# Patient Record
Sex: Female | Born: 1935 | Race: White | Hispanic: No | Marital: Married | Smoking: Never smoker
Health system: Southern US, Community
[De-identification: ages and names within clinical notes are randomized; demographics above are authoritative.]

## PROBLEM LIST (undated history)

## (undated) DIAGNOSIS — I2510 Atherosclerotic heart disease of native coronary artery without angina pectoris: Secondary | ICD-10-CM

## (undated) DIAGNOSIS — R5381 Other malaise: Secondary | ICD-10-CM

## (undated) DIAGNOSIS — I214 Non-ST elevation (NSTEMI) myocardial infarction: Secondary | ICD-10-CM

## (undated) DIAGNOSIS — I10 Essential (primary) hypertension: Secondary | ICD-10-CM

## (undated) DIAGNOSIS — Z9181 History of falling: Secondary | ICD-10-CM

## (undated) DIAGNOSIS — N390 Urinary tract infection, site not specified: Secondary | ICD-10-CM

## (undated) DIAGNOSIS — I330 Acute and subacute infective endocarditis: Secondary | ICD-10-CM

## (undated) DIAGNOSIS — M1990 Unspecified osteoarthritis, unspecified site: Secondary | ICD-10-CM

## (undated) DIAGNOSIS — K219 Gastro-esophageal reflux disease without esophagitis: Secondary | ICD-10-CM

## (undated) DIAGNOSIS — M2550 Pain in unspecified joint: Secondary | ICD-10-CM

## (undated) DIAGNOSIS — J189 Pneumonia, unspecified organism: Secondary | ICD-10-CM

## (undated) DIAGNOSIS — R1310 Dysphagia, unspecified: Secondary | ICD-10-CM

## (undated) DIAGNOSIS — R011 Cardiac murmur, unspecified: Secondary | ICD-10-CM

## (undated) DIAGNOSIS — C44310 Basal cell carcinoma of skin of unspecified parts of face: Secondary | ICD-10-CM

## (undated) DIAGNOSIS — I471 Supraventricular tachycardia, unspecified: Secondary | ICD-10-CM

## (undated) DIAGNOSIS — I639 Cerebral infarction, unspecified: Secondary | ICD-10-CM

## (undated) DIAGNOSIS — I4891 Unspecified atrial fibrillation: Secondary | ICD-10-CM

## (undated) HISTORY — DX: Atherosclerotic heart disease of native coronary artery without angina pectoris: I25.10

## (undated) HISTORY — DX: History of falling: Z91.81

## (undated) HISTORY — DX: Other malaise: R53.81

## (undated) HISTORY — DX: Acute and subacute infective endocarditis: I33.0

## (undated) HISTORY — DX: Essential (primary) hypertension: I10

## (undated) HISTORY — DX: Basal cell carcinoma of skin of unspecified parts of face: C44.310

## (undated) HISTORY — DX: Dysphagia, unspecified: R13.10

---

## 2012-09-10 ENCOUNTER — Ambulatory Visit (INDEPENDENT_AMBULATORY_CARE_PROVIDER_SITE_OTHER): Payer: Medicare Other | Admitting: Podiatry

## 2012-09-10 ENCOUNTER — Encounter: Payer: Self-pay | Admitting: Podiatry

## 2012-09-10 VITALS — BP 147/73 | HR 69 | Ht 61.0 in | Wt 126.0 lb

## 2012-09-10 DIAGNOSIS — B351 Tinea unguium: Secondary | ICD-10-CM | POA: Insufficient documentation

## 2012-09-10 DIAGNOSIS — M25579 Pain in unspecified ankle and joints of unspecified foot: Secondary | ICD-10-CM | POA: Insufficient documentation

## 2012-09-10 NOTE — Progress Notes (Signed)
S:  Getting used to new retirement home life. Has had multiple medical issues including esophagitis, hiatal hernia, back pain, passing out, and poly myalgia.  Patient requests toe nails trimmed. O:  All pedal pulses are palpable. All nails are hypertrophic and thick. 2nd right hurts from being deformed.  No gross osseous deformities. All epicritic and tactile sensations grossly intact. Normal response to Monofilament sensory testing and Vibratory sensation test. A:  Onychomycosis x 10. Pain from deformed toe. P:  Palliation as needed.  All nails debrided.

## 2012-09-10 NOTE — Patient Instructions (Addendum)
All nails debrided. No new problems noted.  Deformed nail on right 2nd.  Circulation normal. Neurologic examination on both feet normal. Return as needed.

## 2012-12-10 ENCOUNTER — Ambulatory Visit: Payer: Medicare Other | Admitting: Podiatry

## 2012-12-10 ENCOUNTER — Ambulatory Visit (INDEPENDENT_AMBULATORY_CARE_PROVIDER_SITE_OTHER): Payer: Medicare Other | Admitting: Podiatry

## 2012-12-10 DIAGNOSIS — M25579 Pain in unspecified ankle and joints of unspecified foot: Secondary | ICD-10-CM

## 2012-12-10 DIAGNOSIS — B351 Tinea unguium: Secondary | ICD-10-CM

## 2012-12-10 NOTE — Progress Notes (Signed)
Subjective: Patient requests toe nails trimmed. She cannot see them well enough to cut. They hurt when they are long. No new problems.  Objective:  All pedal pulses are palpable.  All nails are hypertrophic and thick. 2nd right hurts from being deformed.  No gross osseous deformities.  All epicritic and tactile sensations grossly intact. Normal response to Monofilament sensory testing and Vibratory sensation test.   Assessment:  Onychomycosis x 10.  Pain from deformed toe.   Ppan:  Palliation as needed.  All nails debrided.

## 2013-03-11 ENCOUNTER — Ambulatory Visit: Payer: Medicare Other | Admitting: Podiatry

## 2013-03-15 ENCOUNTER — Ambulatory Visit (INDEPENDENT_AMBULATORY_CARE_PROVIDER_SITE_OTHER): Payer: Medicare Other | Admitting: Podiatry

## 2013-03-15 ENCOUNTER — Ambulatory Visit: Payer: Medicare Other | Admitting: Podiatry

## 2013-03-15 ENCOUNTER — Encounter: Payer: Self-pay | Admitting: Podiatry

## 2013-03-15 VITALS — BP 121/66 | HR 67 | Ht 61.0 in | Wt 125.0 lb

## 2013-03-15 DIAGNOSIS — M25579 Pain in unspecified ankle and joints of unspecified foot: Secondary | ICD-10-CM

## 2013-03-15 DIAGNOSIS — B351 Tinea unguium: Secondary | ICD-10-CM

## 2013-03-15 NOTE — Progress Notes (Signed)
Subjective: Patient requests toe nails trimmed. They hurt when they are long. No new problems.   Objective:  All pedal pulses are palpable.  All nails are hypertrophic and thick. 2nd right hurts from being deformed.  No gross osseous deformities.  All epicritic and tactile sensations grossly intact. Normal response to Monofilament sensory testing and Vibratory sensation test.   Assessment:  Onychomycosis x 10.  Pain from deformed toe nails.  Plan:  Palliation as needed.  All nails debrided.

## 2013-03-15 NOTE — Patient Instructions (Signed)
Seen for hypertrophic nails. All nails debrided. Return in 3 months or as needed.  

## 2013-06-13 ENCOUNTER — Emergency Department (HOSPITAL_BASED_OUTPATIENT_CLINIC_OR_DEPARTMENT_OTHER)
Admission: EM | Admit: 2013-06-13 | Discharge: 2013-06-14 | Disposition: A | Discharge status: Home / Self Care | Payer: Medicare Other | Attending: Emergency Medicine | Admitting: Emergency Medicine

## 2013-06-13 ENCOUNTER — Encounter (HOSPITAL_BASED_OUTPATIENT_CLINIC_OR_DEPARTMENT_OTHER): Payer: Self-pay | Admitting: Emergency Medicine

## 2013-06-13 DIAGNOSIS — Z8739 Personal history of other diseases of the musculoskeletal system and connective tissue: Secondary | ICD-10-CM | POA: Insufficient documentation

## 2013-06-13 DIAGNOSIS — R112 Nausea with vomiting, unspecified: Secondary | ICD-10-CM | POA: Insufficient documentation

## 2013-06-13 DIAGNOSIS — N390 Urinary tract infection, site not specified: Secondary | ICD-10-CM

## 2013-06-13 DIAGNOSIS — R42 Dizziness and giddiness: Secondary | ICD-10-CM | POA: Insufficient documentation

## 2013-06-13 DIAGNOSIS — Z79899 Other long term (current) drug therapy: Secondary | ICD-10-CM | POA: Insufficient documentation

## 2013-06-13 DIAGNOSIS — K219 Gastro-esophageal reflux disease without esophagitis: Secondary | ICD-10-CM | POA: Insufficient documentation

## 2013-06-13 DIAGNOSIS — Z880 Allergy status to penicillin: Secondary | ICD-10-CM | POA: Insufficient documentation

## 2013-06-13 DIAGNOSIS — I4891 Unspecified atrial fibrillation: Secondary | ICD-10-CM | POA: Insufficient documentation

## 2013-06-13 DIAGNOSIS — R51 Headache: Secondary | ICD-10-CM | POA: Insufficient documentation

## 2013-06-13 HISTORY — DX: Pain in unspecified joint: M25.50

## 2013-06-13 HISTORY — DX: Gastro-esophageal reflux disease without esophagitis: K21.9

## 2013-06-13 HISTORY — DX: Unspecified osteoarthritis, unspecified site: M19.90

## 2013-06-13 HISTORY — DX: Unspecified atrial fibrillation: I48.91

## 2013-06-13 MED ORDER — METOCLOPRAMIDE HCL 5 MG/ML IJ SOLN
5.0000 mg | Freq: Once | INTRAMUSCULAR | Status: AC
  Administered 2013-06-13: 5 mg via INTRAVENOUS
  Filled 2013-06-13: qty 2

## 2013-06-13 MED ORDER — SODIUM CHLORIDE 0.9 % IV SOLN
INTRAVENOUS | Status: DC
  Administered 2013-06-13: via INTRAVENOUS

## 2013-06-13 NOTE — ED Notes (Signed)
Pt. Reports to EMS that she has had nausea no vomiting and no diarrhea.  Pt. Also reports a headache.  Pt. Skin is warm and dry.

## 2013-06-13 NOTE — ED Provider Notes (Signed)
CSN: [id number]     Arrival date & time 06/13/13  2312 History   None    Chief Complaint  Patient presents with  . Nausea     (Consider location/radiation/quality/duration/timing/severity/associated sxs/prior Treatment) HPI This is an 80 year old female who developed nausea yesterday evening about 5 PM. She ate a small amount of matter and subsequently began retching. She improved but as she was getting ready for bed started retching again. She denies diarrhea, abdominal pain, chest pain, shortness of breath or dysuria. She has had some dizziness but has difficulty characterizing it. She had a mild headache earlier that is resolving. She is having no focal neurologic deficits. She recently started Cipro for a self diagnosed urinary tract infection. Symptoms are moderate and worse when moving around. She has taken no medications to treat them. She has not taken her evening dose of Lopressor.   Past Medical History  Diagnosis Date  . Gastro-esophageal reflux   . Arthralgia   . A-fib   . Osteoarthritis    No past surgical history on file. No family history on file. History  Substance Use Topics  . Smoking status: Never Smoker   . Smokeless tobacco: Never Used  . Alcohol Use: Not on file   OB History   Grav Para Term Preterm Abortions TAB SAB Ect Mult Living                 Review of Systems  All other systems reviewed and are negative.      Allergies  Iodine; Novocain; Penicillins; and Sulfa antibiotics  Home Medications   Current Outpatient Rx  Name  Route  Sig  Dispense  Refill  . benazepril (LOTENSIN) 10 MG tablet   Oral   Take 10 mg by mouth daily.         . digoxin (LANOXIN) 0.125 MG tablet   Oral   Take 0.125 mg by mouth daily.         . fish oil-omega-3 fatty acids 1000 MG capsule   Oral   Take 2 g by mouth daily.         . furosemide (LASIX) 20 MG tablet               . metoprolol tartrate (LOPRESSOR) 25 MG tablet   Oral   Take 25 mg by  mouth. Half tab twice daily         . NEXIUM 40 MG capsule               . PROAIR HFA 108 (90 BASE) MCG/ACT inhaler               . tobramycin (TOBREX) 0.3 % ophthalmic solution               . traMADol (ULTRAM) 50 MG tablet   Oral   Take 50 mg by mouth 3 (three) times daily as needed.           BP 183/80  Pulse 94  Temp(Src) 98.1 F (36.7 C) (Oral)  Resp 20  SpO2 98%  Physical Exam General: Well-developed, well-nourished female in no acute distress; appearance consistent with age of record HENT: normocephalic; atraumatic Eyes: pupils equal, round and reactive to light; extraocular muscles intact; lens implants Neck: supple Heart: regular rate and rhythm; 3/6 systolic murmur loudest at left upper sternal border Lungs: Clear to auscultation bilaterally; hiccoughs Abdomen: soft; nondistended; nontender; no masses or hepatosplenomegaly; bowel sounds  hyperactive  Extremities: arthritic changes ; pulses normal  Neurologic: Awake, alert and oriented; motor function intact in all extremities and symmetric; no facial droop Skin: Warm and dry Psychiatric: Normal mood and affect    ED Course  Procedures (including critical care time)   MDM   Nursing notes and vitals signs, including pulse oximetry, reviewed.  Summary of this visit's results, reviewed by myself:  Labs:  Results for orders placed during the hospital encounter of 06/13/13 (from the past 24 hour(s))  DIGOXIN LEVEL     Status: None   Collection Time    06/13/13 11:45 PM      Result Value Ref Range   Digoxin Level 0.9  0.8 - 2.0 ng/mL  BASIC METABOLIC PANEL     Status: Abnormal   Collection Time    06/13/13 11:45 PM      Result Value Ref Range   Sodium 129 (*) 137 - 147 mEq/L   Potassium 5.0  3.7 - 5.3 mEq/L   Chloride 92 (*) 96 - 112 mEq/L   CO2 22  19 - 32 mEq/L   Glucose, Bld 145 (*) 70 - 99 mg/dL   BUN 28 (*) 6 - 23 mg/dL   Creatinine, Ser 1.00  0.50 - 1.10 mg/dL   Calcium 9.6  8.4 -  10.5 mg/dL   GFR calc non Af Amer 49 (*) >90 mL/min   GFR calc Af Amer 57 (*) >90 mL/min  CBC WITH DIFFERENTIAL     Status: Abnormal   Collection Time    06/13/13 11:45 PM      Result Value Ref Range   WBC 14.6 (*) 4.0 - 10.5 K/uL   RBC 4.69  3.87 - 5.11 MIL/uL   Hemoglobin 13.9  12.0 - 15.0 g/dL   HCT 40.6  36.0 - 46.0 %   MCV 86.6  78.0 - 100.0 fL   MCH 29.6  26.0 - 34.0 pg   MCHC 34.2  30.0 - 36.0 g/dL   RDW 13.5  11.5 - 15.5 %   Platelets 237  150 - 400 K/uL   Neutrophils Relative % 92 (*) 43 - 77 %   Neutro Abs 13.4 (*) 1.7 - 7.7 K/uL   Lymphocytes Relative 5 (*) 12 - 46 %   Lymphs Abs 0.8  0.7 - 4.0 K/uL   Monocytes Relative 3  3 - 12 %   Monocytes Absolute 0.4  0.1 - 1.0 K/uL   Eosinophils Relative 0  0 - 5 %   Eosinophils Absolute 0.0  0.0 - 0.7 K/uL   Basophils Relative 0  0 - 1 %   Basophils Absolute 0.0  0.0 - 0.1 K/uL  URINALYSIS, ROUTINE W REFLEX MICROSCOPIC     Status: Abnormal   Collection Time    06/14/13  1:30 AM      Result Value Ref Range   Color, Urine YELLOW  YELLOW   APPearance CLEAR  CLEAR   Specific Gravity, Urine 1.022  1.005 - 1.030   pH 5.5  5.0 - 8.0   Glucose, UA NEGATIVE  NEGATIVE mg/dL   Hgb urine dipstick NEGATIVE  NEGATIVE   Bilirubin Urine NEGATIVE  NEGATIVE   Ketones, ur NEGATIVE  NEGATIVE mg/dL   Protein, ur NEGATIVE  NEGATIVE mg/dL   Urobilinogen, UA 0.2  0.0 - 1.0 mg/dL   Nitrite NEGATIVE  NEGATIVE   Leukocytes, UA TRACE (*) NEGATIVE  URINE MICROSCOPIC-ADD ON     Status: Abnormal   Collection Time    06/14/13  1:30 AM  Result Value Ref Range   Squamous Epithelial / LPF RARE  RARE   WBC, UA 7-10  <3 WBC/hpf   RBC / HPF 0-2  <3 RBC/hpf   Bacteria, UA MANY (*) RARE   Urine-Other MUCOUS PRESENT     1:58 AM Nausea, hiccuping improved after Reglan 5 mg IV.  3:00 AM Able to move around without getting nauseated.  6:05 AM Drinking fluids without emesis. Rocephin 1 g given for urinary tract infection. Patient's nausea with  movement may represent vertigo we will treat her with meclizine and she responded well to IV Benadryl.      Wynetta Fines, MD 06/14/13 [phone]

## 2013-06-14 LAB — URINE CULTURE
Colony Count: NO GROWTH
Culture: NO GROWTH

## 2013-06-14 LAB — BASIC METABOLIC PANEL
BUN: 28 mg/dL — ABNORMAL HIGH (ref 6–23)
CALCIUM: 9.6 mg/dL (ref 8.4–10.5)
CO2: 22 mEq/L (ref 19–32)
Chloride: 92 mEq/L — ABNORMAL LOW (ref 96–112)
Creatinine, Ser: 1 mg/dL (ref 0.50–1.10)
GFR calc Af Amer: 57 mL/min — ABNORMAL LOW (ref >90)
GFR, EST NON AFRICAN AMERICAN: 49 mL/min — AB (ref >90)
GLUCOSE: 145 mg/dL — AB (ref 70–99)
Potassium: 5 mEq/L (ref 3.7–5.3)
SODIUM: 129 meq/L — AB (ref 137–147)

## 2013-06-14 LAB — CBC WITH DIFFERENTIAL/PLATELET
Basophils Absolute: 0 10*3/uL (ref 0.0–0.1)
Basophils Relative: 0 % (ref 0–1)
Eosinophils Absolute: 0 10*3/uL (ref 0.0–0.7)
Eosinophils Relative: 0 % (ref 0–5)
HCT: 40.6 % (ref 36.0–46.0)
Hemoglobin: 13.9 g/dL (ref 12.0–15.0)
LYMPHS ABS: 0.8 10*3/uL (ref 0.7–4.0)
LYMPHS PCT: 5 % — AB (ref 12–46)
MCH: 29.6 pg (ref 26.0–34.0)
MCHC: 34.2 g/dL (ref 30.0–36.0)
MCV: 86.6 fL (ref 78.0–100.0)
MONOS PCT: 3 % (ref 3–12)
Monocytes Absolute: 0.4 10*3/uL (ref 0.1–1.0)
NEUTROS ABS: 13.4 10*3/uL — AB (ref 1.7–7.7)
NEUTROS PCT: 92 % — AB (ref 43–77)
PLATELETS: 237 10*3/uL (ref 150–400)
RBC: 4.69 MIL/uL (ref 3.87–5.11)
RDW: 13.5 % (ref 11.5–15.5)
WBC: 14.6 10*3/uL — AB (ref 4.0–10.5)

## 2013-06-14 LAB — URINALYSIS, ROUTINE W REFLEX MICROSCOPIC
Bilirubin Urine: NEGATIVE
GLUCOSE, UA: NEGATIVE mg/dL
HGB URINE DIPSTICK: NEGATIVE
Ketones, ur: NEGATIVE mg/dL
Nitrite: NEGATIVE
PH: 5.5 (ref 5.0–8.0)
Protein, ur: NEGATIVE mg/dL
SPECIFIC GRAVITY, URINE: 1.022 (ref 1.005–1.030)
Urobilinogen, UA: 0.2 mg/dL (ref 0.0–1.0)

## 2013-06-14 LAB — URINE MICROSCOPIC-ADD ON

## 2013-06-14 LAB — DIGOXIN LEVEL: DIGOXIN LVL: 0.9 ng/mL (ref 0.8–2.0)

## 2013-06-14 MED ORDER — MECLIZINE HCL 12.5 MG PO TABS: 12.5000 mg | ORAL_TABLET | Freq: Three times a day (TID) | ORAL | Status: AC | PRN

## 2013-06-14 MED ORDER — CEFTRIAXONE SODIUM 1 G IJ SOLR
INTRAMUSCULAR | Status: AC
  Filled 2013-06-14: qty 10

## 2013-06-14 MED ORDER — CIPROFLOXACIN HCL 250 MG PO TABS: 250.0000 mg | ORAL_TABLET | Freq: Two times a day (BID) | ORAL | Status: DC

## 2013-06-14 MED ORDER — DIPHENHYDRAMINE HCL 50 MG/ML IJ SOLN
12.5000 mg | Freq: Once | INTRAMUSCULAR | Status: AC
  Administered 2013-06-14: 12.5 mg via INTRAVENOUS
  Filled 2013-06-14: qty 1

## 2013-06-14 MED ORDER — DEXTROSE 5 % IV SOLN
1.0000 g | Freq: Once | INTRAVENOUS | Status: AC
  Administered 2013-06-14: 1 g via INTRAVENOUS

## 2013-06-14 MED ORDER — METOPROLOL TARTRATE 50 MG PO TABS
25.0000 mg | ORAL_TABLET | Freq: Once | ORAL | Status: AC
  Administered 2013-06-14: 25 mg via ORAL
  Filled 2013-06-14: qty 1

## 2013-06-14 NOTE — ED Notes (Signed)
ptar notified of need for transport back to [facility]

## 2013-06-15 ENCOUNTER — Ambulatory Visit (INDEPENDENT_AMBULATORY_CARE_PROVIDER_SITE_OTHER): Payer: Medicare Other | Admitting: Podiatry

## 2013-06-15 ENCOUNTER — Encounter: Payer: Self-pay | Admitting: Podiatry

## 2013-06-15 VITALS — BP 148/84 | HR 68 | Ht 61.0 in | Wt 131.0 lb

## 2013-06-15 DIAGNOSIS — M79609 Pain in unspecified limb: Secondary | ICD-10-CM

## 2013-06-15 DIAGNOSIS — B351 Tinea unguium: Secondary | ICD-10-CM

## 2013-06-15 DIAGNOSIS — M79606 Pain in leg, unspecified: Secondary | ICD-10-CM

## 2013-06-15 NOTE — Progress Notes (Signed)
Subjective: Patient requests toe nails trimmed. No new problems.   Objective:  All pedal pulses are palpable.  All nails are hypertrophic and thick. 2nd right hurts from being deformed.  No gross osseous deformities.  All epicritic and tactile sensations grossly intact.   Assessment:  Onychomycosis x 10.  Pain from deformed toe nails.   Plan:  Palliation as needed.  All nails debrided.

## 2013-06-15 NOTE — Patient Instructions (Signed)
Seen for hypertrophic nails. All nails debrided. Return in 3 months or as needed.  

## 2013-09-14 ENCOUNTER — Ambulatory Visit (INDEPENDENT_AMBULATORY_CARE_PROVIDER_SITE_OTHER): Payer: Medicare Other | Admitting: Podiatry

## 2013-09-14 ENCOUNTER — Ambulatory Visit: Payer: Medicare Other | Admitting: Podiatry

## 2013-09-14 ENCOUNTER — Encounter: Payer: Self-pay | Admitting: Podiatry

## 2013-09-14 VITALS — BP 160/86 | HR 76 | Ht 61.0 in | Wt 131.0 lb

## 2013-09-14 DIAGNOSIS — M79606 Pain in leg, unspecified: Secondary | ICD-10-CM

## 2013-09-14 DIAGNOSIS — M79609 Pain in unspecified limb: Secondary | ICD-10-CM

## 2013-09-14 DIAGNOSIS — B351 Tinea unguium: Secondary | ICD-10-CM

## 2013-09-14 NOTE — Patient Instructions (Signed)
Seen for hypertrophic nails. All nails debrided. Return in 3 months or as needed.  

## 2013-09-14 NOTE — Progress Notes (Signed)
Subjective: 80 year old female presents requesting toe nails trimmed. Couple of nails ingrown and hurts.  No new problems.   Objective:  All pedal pulses are palpable.  All nails are hypertrophic and thick.  2nd right hurts from being deformed.  No gross osseous deformities.  All epicritic and tactile sensations grossly intact.   Assessment:  Onychomycosis x 10.  Pain from deformed toe nails.   Plan:  Palliation as needed.  All nails debrided.

## 2014-05-26 DIAGNOSIS — R1310 Dysphagia, unspecified: Secondary | ICD-10-CM

## 2014-05-26 DIAGNOSIS — I214 Non-ST elevation (NSTEMI) myocardial infarction: Secondary | ICD-10-CM

## 2014-05-26 DIAGNOSIS — I639 Cerebral infarction, unspecified: Secondary | ICD-10-CM | POA: Insufficient documentation

## 2014-05-26 DIAGNOSIS — I2510 Atherosclerotic heart disease of native coronary artery without angina pectoris: Secondary | ICD-10-CM

## 2014-05-26 DIAGNOSIS — I330 Acute and subacute infective endocarditis: Secondary | ICD-10-CM

## 2014-05-26 HISTORY — DX: Non-ST elevation (NSTEMI) myocardial infarction: I21.4

## 2014-05-26 HISTORY — DX: Acute and subacute infective endocarditis: I33.0

## 2014-05-26 HISTORY — DX: Cerebral infarction, unspecified: I63.9

## 2014-05-26 HISTORY — DX: Atherosclerotic heart disease of native coronary artery without angina pectoris: I25.10

## 2014-05-26 HISTORY — DX: Dysphagia, unspecified: R13.10

## 2014-05-27 ENCOUNTER — Emergency Department (HOSPITAL_COMMUNITY)
Admission: EM | Admit: 2014-05-27 | Discharge: 2014-05-27 | Disposition: A | Discharge status: Home / Self Care | Payer: Medicare Other | Attending: Emergency Medicine | Admitting: Emergency Medicine

## 2014-05-27 ENCOUNTER — Encounter (HOSPITAL_COMMUNITY): Payer: Self-pay | Admitting: Emergency Medicine

## 2014-05-27 DIAGNOSIS — Z880 Allergy status to penicillin: Secondary | ICD-10-CM | POA: Insufficient documentation

## 2014-05-27 DIAGNOSIS — Z792 Long term (current) use of antibiotics: Secondary | ICD-10-CM | POA: Diagnosis not present

## 2014-05-27 DIAGNOSIS — R011 Cardiac murmur, unspecified: Secondary | ICD-10-CM | POA: Insufficient documentation

## 2014-05-27 DIAGNOSIS — Z7982 Long term (current) use of aspirin: Secondary | ICD-10-CM | POA: Insufficient documentation

## 2014-05-27 DIAGNOSIS — T82898A Other specified complication of vascular prosthetic devices, implants and grafts, initial encounter: Secondary | ICD-10-CM

## 2014-05-27 DIAGNOSIS — K219 Gastro-esophageal reflux disease without esophagitis: Secondary | ICD-10-CM | POA: Diagnosis not present

## 2014-05-27 DIAGNOSIS — I471 Supraventricular tachycardia: Secondary | ICD-10-CM | POA: Diagnosis not present

## 2014-05-27 DIAGNOSIS — I4891 Unspecified atrial fibrillation: Secondary | ICD-10-CM | POA: Diagnosis not present

## 2014-05-27 DIAGNOSIS — Z87440 Personal history of urinary (tract) infections: Secondary | ICD-10-CM | POA: Diagnosis not present

## 2014-05-27 DIAGNOSIS — M1990 Unspecified osteoarthritis, unspecified site: Secondary | ICD-10-CM | POA: Diagnosis not present

## 2014-05-27 DIAGNOSIS — T82598A Other mechanical complication of other cardiac and vascular devices and implants, initial encounter: Secondary | ICD-10-CM | POA: Insufficient documentation

## 2014-05-27 DIAGNOSIS — Z79899 Other long term (current) drug therapy: Secondary | ICD-10-CM | POA: Insufficient documentation

## 2014-05-27 DIAGNOSIS — Z8701 Personal history of pneumonia (recurrent): Secondary | ICD-10-CM | POA: Diagnosis not present

## 2014-05-27 DIAGNOSIS — R4182 Altered mental status, unspecified: Secondary | ICD-10-CM | POA: Insufficient documentation

## 2014-05-27 DIAGNOSIS — Y832 Surgical operation with anastomosis, bypass or graft as the cause of abnormal reaction of the patient, or of later complication, without mention of misadventure at the time of the procedure: Secondary | ICD-10-CM | POA: Insufficient documentation

## 2014-05-27 DIAGNOSIS — Z8673 Personal history of transient ischemic attack (TIA), and cerebral infarction without residual deficits: Secondary | ICD-10-CM | POA: Insufficient documentation

## 2014-05-27 DIAGNOSIS — I252 Old myocardial infarction: Secondary | ICD-10-CM | POA: Diagnosis not present

## 2014-05-27 HISTORY — DX: Non-ST elevation (NSTEMI) myocardial infarction: I21.4

## 2014-05-27 HISTORY — DX: Pneumonia, unspecified organism: J18.9

## 2014-05-27 HISTORY — DX: Urinary tract infection, site not specified: N39.0

## 2014-05-27 HISTORY — DX: Supraventricular tachycardia, unspecified: I47.10

## 2014-05-27 HISTORY — DX: Cardiac murmur, unspecified: R01.1

## 2014-05-27 HISTORY — DX: Supraventricular tachycardia: I47.1

## 2014-05-27 HISTORY — DX: Cerebral infarction, unspecified: I63.9

## 2014-05-27 MED ORDER — ALTEPLASE 2 MG IJ SOLR
2.0000 mg | Freq: Once | INTRAMUSCULAR | Status: AC
  Administered 2014-05-27: 2 mg
  Filled 2014-05-27: qty 2

## 2014-05-27 NOTE — ED Notes (Signed)
IV team reports both port patent and working properly.

## 2014-05-27 NOTE — ED Notes (Signed)
Pt brief changed and new brief applied post pericare.

## 2014-05-27 NOTE — ED Notes (Signed)
Bed: BB40 Expected date:  Expected time:  Means of arrival:  Comments: 81 yo EMS

## 2014-05-27 NOTE — ED Notes (Addendum)
Per IV team red port works; orders to be placed for purple port declot process must sit for 2 hours.

## 2014-05-27 NOTE — ED Notes (Signed)
Apple sauce and ice water given to pt per family request.

## 2014-05-27 NOTE — ED Notes (Signed)
Per nurse from facility EMS story accurate and bleeding around site new with attempt to flush today around 1245.

## 2014-05-27 NOTE — ED Notes (Signed)
IV team at bedside attempting declot of PICC line.

## 2014-05-27 NOTE — Discharge Instructions (Signed)
Continue antibiotics as scheduled through PICC line. Make sure pt gets her antibiotics TODAY. Follow up with primary care doctor.

## 2014-05-27 NOTE — ED Notes (Signed)
PICC line flushed again by this nurse due to patient's daughter's request

## 2014-05-27 NOTE — ED Notes (Addendum)
No orders for PICC line flush at nursing home noted in [facility] EDP made aware and order for PICC line flush to be sent back with patient PICC line to be flushed by IV team nurse after admin of tPA--patient's family members made aware of this

## 2014-05-27 NOTE — ED Provider Notes (Signed)
CSN: [id number]     Arrival date & time 05/27/14  1317 History   First MD Initiated Contact with Patient 05/27/14 1506     Chief Complaint  Patient presents with  . PICC line complication      (Consider location/radiation/quality/duration/timing/severity/associated sxs/prior Treatment) HPI Unknown Anne Armstrong is a 81 y.o. female with history of coronary disease, stroke, pneumonia, A. fib, UTIs, presents to emergency department with complaint of PICC line problem. Patient was admitted to [facility] with sepsis, encephalopathy, and endocarditis according to her family. She was discharged yesterday and to rehabilitation facility. She was discharged with a PICC line for continuous antibiotic infusions. According to patient's daughter, patient has not received any antibiotics since yesterday, and when they attempted to flush the PICC line today it was not working. She was sent here for further evaluation. According to the patient's husband, she looks much better today. Deny fever, chills. Pt still lethargic.   Past Medical History  Diagnosis Date  . Gastro-esophageal reflux   . Arthralgia   . A-fib   . Osteoarthritis   . Pneumonia   . UTI (urinary tract infection)   . NSTEMI (non-ST elevated myocardial infarction)   . Heart murmur   . Stroke   . SVT (supraventricular tachycardia)    History reviewed. No pertinent past surgical history. No family history on file. History  Substance Use Topics  . Smoking status: Never Smoker   . Smokeless tobacco: Never Used  . Alcohol Use: No   OB History    No data available     Review of Systems  Unable to perform ROS: Mental status change      Allergies  Iodine; Macrobid; Novocain; Penicillins; Shellfish allergy; and Sulfa antibiotics  Home Medications   Prior to Admission medications   Medication Sig Start Date End Date Taking? Authorizing Provider  aspirin 325 MG tablet Take 325 mg by mouth daily.   Yes Historical Provider, MD   atorvastatin (LIPITOR) 20 MG tablet Take 20 mg by mouth every evening.   Yes Historical Provider, MD  cefTRIAXone 2 g in dextrose 5 % 50 mL Inject 2 g into the vein daily. For 4 weeks 05/26/14 06/21/14 Yes Historical Provider, MD  doxycycline (VIBRA-TABS) 100 MG tablet Take 100 mg by mouth 2 (two) times daily. For 10 days 05/26/14 06/05/14 Yes Historical Provider, MD  gabapentin (NEURONTIN) 300 MG capsule Take 300 mg by mouth 3 (three) times daily.    Yes Historical Provider, MD  hydrALAZINE (APRESOLINE) 25 MG tablet Take 25 mg by mouth 3 (three) times daily.   Yes Historical Provider, MD  meclizine (ANTIVERT) 25 MG tablet Take 25 mg by mouth 3 (three) times daily as needed for dizziness.   Yes Historical Provider, MD  metoprolol tartrate (LOPRESSOR) 25 MG tablet Take 25 mg by mouth 2 (two) times daily.  06/26/12  Yes Historical Provider, MD  omeprazole (PRILOSEC) 40 MG capsule Take 40 mg by mouth daily before breakfast.  08/19/13  Yes Historical Provider, MD  PROAIR HFA 108 (90 BASE) MCG/ACT inhaler Inhale 1 puff into the lungs every 6 (six) hours as needed for wheezing or shortness of breath.  03/09/13  Yes Historical Provider, MD  traMADol (ULTRAM) 50 MG tablet Take 50 mg by mouth 4 (four) times daily as needed for moderate pain.  08/18/12  Yes Historical Provider, MD  meclizine (ANTIVERT) 12.5 MG tablet Take 1 tablet (12.5 mg total) by mouth 3 (three) times daily as needed for dizziness or nausea.  Patient not taking: Reported on 05/27/2014 06/14/13   Karen Chafe Molpus, MD   BP 150/73 mmHg  Pulse 91  Temp(Src) 98.2 F (36.8 C) (Oral)  Resp 22  SpO2 98% Physical Exam  Constitutional: She appears well-developed and well-nourished. No distress.  HENT:  Head: Normocephalic.  Eyes: Conjunctivae are normal.  Neck: Neck supple.  Cardiovascular: Normal rate, regular rhythm and normal heart sounds.   Pulmonary/Chest: Effort normal and breath sounds normal. No respiratory distress. She has no wheezes. She has  no rales.  Abdominal: Soft. Bowel sounds are normal. She exhibits no distension. There is no tenderness. There is no rebound.  Musculoskeletal: She exhibits no edema.  PICC line in right extremity, normal appearing with no evidence of infection  Neurological: She is alert.  Skin: Skin is warm and dry.  Psychiatric: She has a normal mood and affect. Her behavior is normal.  Nursing note and vitals reviewed.   ED Course  Procedures (including critical care time) Labs Review Labs Reviewed - No data to display  Imaging Review No results found.   EKG Interpretation None      MDM   Final diagnoses:  Occluded PICC line, initial encounter   Pt with malfunctionin PICC line in right upper arm. Not flushing. Unknown when placed, however, was just discharged yesterday into rehab, they have not used it yet. IV team here to place TPA. Will have to wait 2 hrs. Family informed of plan.  5:18 PM PICC line checked again by IV team. Flushing good. Pt ready for d/c. She is afebrile. Non toxic appearing. No complaints.   Filed Vitals:   05/27/14 1531  BP: 150/73  Pulse: 91  Temp: 98.2 F (36.8 C)  Resp: [address] Kaylei Frink, PA-C 05/27/14 [place], MD 05/27/14 [phone]

## 2014-05-27 NOTE — ED Notes (Signed)
Awaiting arrival of PTAR

## 2014-05-27 NOTE — ED Notes (Signed)
Pt had PICC line placed in right arm yesterday. Staff attempt to flush without success; able to draw back blood but not flush.

## 2014-05-29 ENCOUNTER — Non-Acute Institutional Stay (SKILLED_NURSING_FACILITY): Payer: Medicare Other | Admitting: Internal Medicine

## 2014-05-29 ENCOUNTER — Encounter: Payer: Self-pay | Admitting: Internal Medicine

## 2014-05-29 DIAGNOSIS — I330 Acute and subacute infective endocarditis: Secondary | ICD-10-CM

## 2014-05-29 DIAGNOSIS — R0602 Shortness of breath: Secondary | ICD-10-CM

## 2014-05-29 DIAGNOSIS — R410 Disorientation, unspecified: Secondary | ICD-10-CM

## 2014-05-29 DIAGNOSIS — A419 Sepsis, unspecified organism: Secondary | ICD-10-CM

## 2014-05-29 NOTE — Progress Notes (Signed)
Patient ID: Anne Armstrong, female   DOB: [birth year], 81 y.o.   MRN: [id number]     [facility]   PCP: Imagene Riches, MD   Allergies  Allergen Reactions  . Iodine     Unknown reaction per MAR   . Macrobid [Nitrofurantoin]     Unknown reaction per MAR   . Novocain [Procaine]     Unknown reaction per MAR   . Penicillins     Unknown reaction per MAR   . Shellfish Allergy     Unknown reaction per MAR   . Sulfa Antibiotics     Unknown reaction per [facility]     Chief Complaint  Patient presents with  . New Admit To SNF     HPI:  81 year old patient is here for short term rehabilitation post hospital admission from 05/20/14-05/26/14 generalized weakness. She was diagnosed to have MDR E.Coli UTI and bilateral pneumonia with sepsis and was started on antibiotic treatment for it. She then had troponin leak. ekg showed no ST wave elevation. It was thought to be NSTEMI from demand ischemia in setting of sepsis and this was medically managed. 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 Cardiology was involved and felt TEE would not provide additional information at present. Her blood culture was positive for streptococcus viridans. ID was consulted she was started on vancomycin, azitrhomycin and rocephin. This was later switched to po doxycycline and iv rocephin. She had septic emboli with multiple CVA in both cerebral and cerebellar area. Neurology was consulted and advised to continue her aspirin and lipitor. With her complicated hospital stay she was severely deconditioned and weak. She was sent to SNF for rehabilitation. She is seen in her room today with her daughter and son in the room. The staff had just taken her morning vitals and this reveals temperature of 102.1 and she is tachycardic. She is on a recliner, lethargic and confused. As per daughter pt was more alert this am and interactive. She had not had a temperature spike since admission to  the facility. Of note, there is concern of pt not receiving her antibiotic on Saturday with picc line blockade. She was sent to the ED to get this corrected. Daughter mentions she did not get any antibiotics after  Returning back to the facility that day.  Review of Systems:  Constitutional: positive for fever, chills, malaise/fatigue. Not diaphoretic at present HENT: Negative for headache, congestion, nasal discharge Respiratory: Negative for cough, wheezing.  she feels short of breath Cardiovascular: Negative for chest pain, palpitations. Family has noticed increase swelling in her arms and legs this am  Gastrointestinal: Negative for heartburn, nausea, vomiting, abdominal pain Genitourinary: Negative for dysuria, flank pain.  Musculoskeletal: Negative for trauma/ falls Skin: Negative for itching, rash.  Neurological: Negative for dizziness, tingling, focal weakness   Past Medical History  Diagnosis Date  . Gastro-esophageal reflux   . Arthralgia   . A-fib   . Osteoarthritis   . Pneumonia   . UTI (urinary tract infection)   . NSTEMI (non-ST elevated myocardial infarction)   . Heart murmur   . Stroke   . SVT (supraventricular tachycardia)    History reviewed. No pertinent past surgical history. Social History:   reports that she has never smoked. She has never used smokeless tobacco. She reports that she does not drink alcohol or use illicit drugs.  History reviewed. No pertinent family history.  Medications: Patient's Medications  New Prescriptions   No  medications on file  Previous Medications   ASPIRIN 325 MG TABLET    Take 325 mg by mouth daily.   ATORVASTATIN (LIPITOR) 20 MG TABLET    Take 20 mg by mouth every evening.   CEFTRIAXONE 2 G IN DEXTROSE 5 % 50 ML    Inject 2 g into the vein daily. For 4 weeks   DOXYCYCLINE (VIBRA-TABS) 100 MG TABLET    Take 100 mg by mouth 2 (two) times daily. For 10 days   GABAPENTIN (NEURONTIN) 300 MG CAPSULE    Take 300 mg by mouth 3  (three) times daily.    HYDRALAZINE (APRESOLINE) 25 MG TABLET    Take 25 mg by mouth 3 (three) times daily.   MECLIZINE (ANTIVERT) 12.5 MG TABLET    Take 1 tablet (12.5 mg total) by mouth 3 (three) times daily as needed for dizziness or nausea.   MECLIZINE (ANTIVERT) 25 MG TABLET    Take 25 mg by mouth 3 (three) times daily as needed for dizziness.   METOPROLOL TARTRATE (LOPRESSOR) 25 MG TABLET    Take 25 mg by mouth 2 (two) times daily.    OMEPRAZOLE (PRILOSEC) 40 MG CAPSULE    Take 40 mg by mouth daily before breakfast.    PROAIR HFA 108 (90 BASE) MCG/ACT INHALER    Inhale 1 puff into the lungs every 6 (six) hours as needed for wheezing or shortness of breath.    TRAMADOL (ULTRAM) 50 MG TABLET    Take 50 mg by mouth 4 (four) times daily as needed for moderate pain.   Modified Medications   No medications on file  Discontinued Medications   No medications on file     Physical Exam: Filed Vitals:   05/29/14 1946  BP: 136/75  Pulse: 120  Temp: 102.1 F (38.9 C)  Resp: 20  SpO2: 95%    General- elderly female, frail, in no acute distress Head- normocephalic, atraumatic Nose-  no maxillary or frontal sinus tenderness, no nasal discharge Throat- moist mucus membrane Neck- no cervical lymphadenopathy Chest- no chest wall tenderness Cardiovascular- tachycardic, palpable dorsalis pedis and radial pulses, 1+ bilateral leg edema Respiratory- poor inspiratory effort, no wheeze, no rhonchi, no crackles, no use of accessory muscles Abdomen- bowel sounds present, soft, non tender, no CVA tenderness Musculoskeletal- generalized weakness, only following simple commands, on a geri chair Neurological- confused, less interactive, minimal command following Skin- warm and dry   Labs reviewed: Basic Metabolic Panel:  Recent Labs  06/13/13 2345  NA 129*  K 5.0  CL 92*  CO2 22  GLUCOSE 145*  BUN 28*  CREATININE 1.00  CALCIUM 9.6   CBC:  Recent Labs  06/13/13 2345  WBC 14.6*    NEUTROABS 13.4*  HGB 13.9  HCT 40.6  MCV 86.6  PLT 237   05/26/14 na 130, k 4.6, cl 92, co2 28, glu 104, bun 18, cr 0.82, wbc 11, hb 11.2, hct 33.6, plt 318  Assessment/Plan  Sepsis With new onset fever, tachycardia and recently diagnosed infective endocarditis there is concern for sepsis with clear source of infection. Given her change in mental status, will send her to ED for sepsis workup and treatment  AMS Recent diagnosis of IE and septic emboli. Concern for another emboli with change of mental status. Sending paitent to ED for further management  IE Of mitral valve. On rocephin at present, but with her temp spike, might benefit from broadening her antibiotic coverage until culture is resulted  Shortness of  breath Her new onset sepsis could be making her short of breath but with recent septic emboli, concern for a PE present. o2 sat normal at present. Will have her on o2 for now. Will need CTA to assess further  Family/ staff Communication: reviewed care plan with patient, family and nursing supervisor. Family agrees with transfer to the ED. Spent more than 50 minutes in patient care.    Blanchie Serve, MD  [facility] [phone] (Monday-Friday 8 am - 5 pm) [phone] (afterhours)

## 2014-06-09 ENCOUNTER — Non-Acute Institutional Stay (SKILLED_NURSING_FACILITY): Payer: Medicare Other | Admitting: Adult Health

## 2014-06-09 ENCOUNTER — Encounter: Payer: Self-pay | Admitting: Adult Health

## 2014-06-09 DIAGNOSIS — G629 Polyneuropathy, unspecified: Secondary | ICD-10-CM

## 2014-06-09 DIAGNOSIS — R531 Weakness: Secondary | ICD-10-CM

## 2014-06-09 DIAGNOSIS — I330 Acute and subacute infective endocarditis: Secondary | ICD-10-CM

## 2014-06-09 DIAGNOSIS — J189 Pneumonia, unspecified organism: Secondary | ICD-10-CM

## 2014-06-09 DIAGNOSIS — E785 Hyperlipidemia, unspecified: Secondary | ICD-10-CM

## 2014-06-09 DIAGNOSIS — K219 Gastro-esophageal reflux disease without esophagitis: Secondary | ICD-10-CM

## 2014-06-09 DIAGNOSIS — E43 Unspecified severe protein-calorie malnutrition: Secondary | ICD-10-CM

## 2014-06-09 NOTE — Progress Notes (Signed)
Patient ID: Anne Armstrong, female   DOB: 13-Aug-1923, 81 y.o.   MRN: [id number]    06/09/2014  Facility:  Nursing Home Location:  [place] Room Number: 407-P LEVEL OF CARE:  SNF (31)   Chief Complaint  Patient presents with  . Hospitalization Follow-up    Generalized weakness, infective endocarditis, Pneumonia, protein calorie malnutrition, hypertension, hyperlipidemia, neuropathy and GERD    HISTORY OF PRESENT ILLNESS:  This is an 81 year old female who has been readmitted to [facility] on 04/05/15 from [facility] 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  She was transferred back to the hospital from [facility] due to fever of 102 elevated WBC of 16 and confusion. She was given vancomycin and cefepime IV. Infectious disease recommended shifting to Rocephin 2 g IV daily 2 weeks. She has been admitted for a short-term rehabilitation due to generalized weakness and functional debility. Palliative and Hospice consult were done while in the hospital regarding goals of care. However, daughter and son does not wish to pursue hospice @ this time.  PAST MEDICAL HISTORY:  Past Medical History  Diagnosis Date  . Gastro-esophageal reflux   . Arthralgia   . A-fib   . Osteoarthritis   . Pneumonia   . UTI (urinary tract infection)   . NSTEMI (non-ST elevated myocardial infarction)   . Heart murmur   . Stroke   . SVT (supraventricular tachycardia)     CURRENT MEDICATIONS: Reviewed per MAR/see medication list  Allergies  Allergen Reactions  . Iodine     Unknown reaction per MAR   . Macrobid [Nitrofurantoin]     Unknown reaction per MAR   . Novocain [Procaine]     Unknown reaction per MAR   . Penicillins     Unknown reaction per MAR   . Shellfish Allergy     Unknown reaction per  MAR   . Sulfa Antibiotics     Unknown reaction per [facility]      REVIEW OF SYSTEMS:  GENERAL: no fever or chills  RESPIRATORY: no cough, SOB, DOE, wheezing, hemoptysis CARDIAC: no chest pain, or palpitations GI: no abdominal pain, diarrhea, constipation, heart burn, nausea or vomiting  PHYSICAL EXAMINATION  GENERAL: sleepy, arousable EYES: conjunctivae normal, sclerae normal, normal eye lids NECK: supple, trachea midline, no neck masses, no thyroid tenderness, no thyromegaly LYMPHATICS: no LAN in the neck, no supraclavicular LAN RESPIRATORY: breathing is even & unlabored, BS rales on right lung field CARDIAC: RRR, no extra heart sounds,  BUE and BLE edema 2+ GI: abdomen soft, normal BS, no masses, no tenderness, no hepatomegaly, no splenomegaly EXTREMITIES:  Able to move BUE, does not move BLE PSYCHIATRIC: the patient is alert & oriented to person, affect & behavior appropriate  LABS/RADIOLOGY: Labs reviewed: Basic Metabolic Panel:  Recent Labs  06/13/13 2345  NA 129*  K 5.0  CL 92*  CO2 22  GLUCOSE 145*  BUN 28*  CREATININE 1.00  CALCIUM 9.6   CBC:  Recent Labs  06/13/13 2345  WBC 14.6*  NEUTROABS 13.4*  HGB 13.9  HCT 40.6  MCV 86.6  PLT 237   05/26/14 na 130, k 4.6, cl 92, co2 28, glu 104, bun 18, cr 0.82, wbc 11, hb 11.2, hct 33.6, plt 318 06/09/14  WBC 6.5 hemoglobin 9.9 hematocrit 29.6 MCV 84.3 sodium 140 potassium 3.5  glucose 91 creatinine 0.63 alkaline phosphatase 106 SGOT 20 SGPT 16 total protein 4.8 albumin 2.5 calcium 7.9  ASSESSMENT/PLAN:  Generalized weakness  - for rehabilitation  Infective endocarditis - continue Rocephin 2 g IV daily X 4 weeks Pneumonia - continue doxycycline 100 mg by mouth twice a day for a total of 10 days and Rocephin Hyperlipidemia - continue atorvastatin 20 mg by mouth every afternoon Neuropathy - continue gabapentin 300 mg 1 capsule by mouth 3 times a day GERD - continue omeprazole 20 mg 1 capsule by mouth daily Protein  calorie malnutrition, severe - albumin 2.5; continue supplementation; RD consult Edema of extremities - Lasix 20 mg PO Q D X 3 days  Hypertension - increase hydralazine to 25 mg by mouth 4 times a day and continue metoprolol 25 mg 1 tab by mouth twice a day; BP/heart rate every shift 1 week   Goals of care:  Short-term rehabilitation  Labs/test ordered:   BMP on 06/13/14  Spent 50 minutes in patient care.    [facility], NP [employer] [phone]

## 2014-06-13 ENCOUNTER — Non-Acute Institutional Stay (SKILLED_NURSING_FACILITY): Payer: Medicare Other | Admitting: Adult Health

## 2014-06-13 DIAGNOSIS — R600 Localized edema: Secondary | ICD-10-CM

## 2014-06-16 ENCOUNTER — Encounter: Payer: Self-pay | Admitting: Internal Medicine

## 2014-06-16 ENCOUNTER — Encounter: Payer: Self-pay | Admitting: Adult Health

## 2014-06-16 ENCOUNTER — Non-Acute Institutional Stay (SKILLED_NURSING_FACILITY): Payer: Medicare Other | Admitting: Internal Medicine

## 2014-06-16 DIAGNOSIS — I482 Chronic atrial fibrillation, unspecified: Secondary | ICD-10-CM

## 2014-06-16 DIAGNOSIS — I639 Cerebral infarction, unspecified: Secondary | ICD-10-CM | POA: Diagnosis not present

## 2014-06-16 DIAGNOSIS — Z9181 History of falling: Secondary | ICD-10-CM

## 2014-06-16 DIAGNOSIS — I2510 Atherosclerotic heart disease of native coronary artery without angina pectoris: Secondary | ICD-10-CM | POA: Diagnosis not present

## 2014-06-16 DIAGNOSIS — R011 Cardiac murmur, unspecified: Secondary | ICD-10-CM | POA: Diagnosis not present

## 2014-06-16 DIAGNOSIS — I330 Acute and subacute infective endocarditis: Secondary | ICD-10-CM

## 2014-06-16 DIAGNOSIS — R1310 Dysphagia, unspecified: Secondary | ICD-10-CM

## 2014-06-16 DIAGNOSIS — I288 Other diseases of pulmonary vessels: Secondary | ICD-10-CM | POA: Diagnosis not present

## 2014-06-16 DIAGNOSIS — C44310 Basal cell carcinoma of skin of unspecified parts of face: Secondary | ICD-10-CM

## 2014-06-16 DIAGNOSIS — R5381 Other malaise: Secondary | ICD-10-CM | POA: Diagnosis not present

## 2014-06-16 DIAGNOSIS — I4891 Unspecified atrial fibrillation: Secondary | ICD-10-CM | POA: Insufficient documentation

## 2014-06-16 DIAGNOSIS — I214 Non-ST elevation (NSTEMI) myocardial infarction: Secondary | ICD-10-CM

## 2014-06-16 DIAGNOSIS — I10 Essential (primary) hypertension: Secondary | ICD-10-CM

## 2014-06-16 NOTE — Progress Notes (Signed)
Patient ID: Anne Armstrong, female   DOB: April 03, 1924, 81 y.o.   MRN: [id number]    HISTORY AND PHYSICAL  Location:  [place] Room Number: 407 Place of Service: SNF (31)   Extended Emergency Contact Information Primary Emergency Contact: Thau,Carl  [place] Phone: [phone] Relation: Spouse Secondary Emergency Contact: Elliot Gur[place] Phone: [phone] Relation: Daughter  Advanced Directive information Does patient have an advance directive?: Yes, Type of Advance Directive: Out of facility DNR (pink MOST or yellow form), Pre-existing out of facility DNR order (yellow form or pink MOST form): Yellow form placed in chart (order not valid for inpatient use), Does patient want to make changes to advanced directive?: No - Patient declined  Chief Complaint  Patient presents with  . Readmit To SNF    Following rehospitalization    HPI:  81 year old woman who was originally hospitalized from 05/20/14 through 05/26/14. She entered the hospital with fever and weakness. She was ultimately found to have strep viridans endocarditis with septic emboli to the brain including both cerebellum and cerebral hemispheres.  She was sent to nursing facility following that hospitalization and arrived on 05/26/14. She began spiking fevers and was rehospitalized from 05/29/14 through 06/05/14. Again had an altered mental status. Antibiotics were started. She'll be treated for 4 weeks in total with a course of vancomycin and cefepime initially, and then Rocephin to complete 4 weeks.  During her hospitalization there was a rise in CPK MB and it was felt that she had sustained an NSTEMI.  Because of the strokes brought on by the septic emboli, patient's swallowing abilities were impaired. Modified barium swallowing study indicated a need for thickened liquids and chopped foods.  Patient is in atrial fibrillation and has been, but  she is not anticoagulated due to a previous fall risk and multiple falls.  Other problems include hypertension, GERD, and coronary artery disease.  Previous hospitalizations have been at [facility].  Because of her severe debility brought on by the strokes, her current goal is for strengthening and to hope for some safe mobility in the future. In the past she used a walker to get around her house but was otherwise independent. Now she cannot feed herself, dress herself, or walk. She is barely able to lift her legs when in a seated position against gravity. Grip strength is impaired.  Past Medical History  Diagnosis Date  . Gastro-esophageal reflux   . Arthralgia   . A-fib   . Osteoarthritis   . Pneumonia   . UTI (urinary tract infection)   . NSTEMI (non-ST elevated myocardial infarction) 05/26/14  . Heart murmur   . Stroke 05/26/14    Septic emboli to cerebral and cerebellar hemispheres  . SVT (supraventricular tachycardia)   . 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 CAD (coronary artery disease) 05/26/14  . Dysphagia 05/26/14  . Hypertension   . History of fall   . Basal cell carcinoma of face   . Debility     No past surgical history on file.  Patient Care Team: Imagene Riches, MD as PCP - General (Internal Medicine)  History   Social History  . Marital Status: Married    Spouse Name: N/A  . Number of Children: N/A  . Years of Education: N/A   Occupational History  . Not on file.   Social History Main Topics  .  Smoking status: Never Smoker   . Smokeless tobacco: Never Used  . Alcohol Use: No  . Drug Use: No  . Sexual Activity: Not on file   Other Topics Concern  . Not on file   Social History Narrative     reports that she has never smoked. She has never used smokeless tobacco. She reports that she does not drink alcohol or use illicit drugs.  No family history on file. No family status  information on file.     There is no immunization history on file for this patient.  Allergies  Allergen Reactions  . Iodine     Unknown reaction per MAR   . Macrobid [Nitrofurantoin]     Unknown reaction per MAR   . Novocain [Procaine]     Unknown reaction per MAR   . Penicillins     Unknown reaction per MAR   . Shellfish Allergy     Unknown reaction per MAR   . Sulfa Antibiotics     Unknown reaction per MAR     Medications: Patient's Medications  New Prescriptions   No medications on file  Previous Medications   ASPIRIN 325 MG TABLET    Take 325 mg by mouth daily.   ATORVASTATIN (LIPITOR) 20 MG TABLET    Take 20 mg by mouth every evening.   CEFTRIAXONE 2 G IN DEXTROSE 5 % 50 ML    Inject 2 g into the vein daily. For 4 weeks   GABAPENTIN (NEURONTIN) 300 MG CAPSULE    Take 300 mg by mouth 3 (three) times daily.    HYDRALAZINE (APRESOLINE) 25 MG TABLET    Take 25 mg by mouth 3 (three) times daily.   MECLIZINE (ANTIVERT) 12.5 MG TABLET    Take 1 tablet (12.5 mg total) by mouth 3 (three) times daily as needed for dizziness or nausea.   MECLIZINE (ANTIVERT) 25 MG TABLET    Take 25 mg by mouth 3 (three) times daily as needed for dizziness.   METOPROLOL TARTRATE (LOPRESSOR) 25 MG TABLET    Take 25 mg by mouth 2 (two) times daily.    OMEPRAZOLE (PRILOSEC) 40 MG CAPSULE    Take 40 mg by mouth daily before breakfast.    PROAIR HFA 108 (90 BASE) MCG/ACT INHALER    Inhale 1 puff into the lungs every 6 (six) hours as needed for wheezing or shortness of breath.    TRAMADOL (ULTRAM) 50 MG TABLET    Take 50 mg by mouth 4 (four) times daily as needed for moderate pain.   Modified Medications   No medications on file  Discontinued Medications   No medications on file    Review of Systems  Constitutional: Positive for activity change and fatigue. Negative for fever, chills, diaphoresis, appetite change and unexpected weight change.       E.xtremely weak, frail, elderly female  HENT:  Positive for hearing loss and trouble swallowing. Negative for congestion, ear discharge, ear pain, postnasal drip, rhinorrhea, sore throat, tinnitus and voice change.   Eyes: Negative for pain, redness, itching and visual disturbance.  Respiratory: Negative for cough, choking, shortness of breath and wheezing.   Cardiovascular: Positive for leg swelling. Negative for chest pain and palpitations.       Recent NSTEMI February 2016  Gastrointestinal: Negative for nausea, abdominal pain, diarrhea, constipation and abdominal distention.  Endocrine: Negative for cold intolerance, heat intolerance, polydipsia, polyphagia and polyuria.  Genitourinary: Negative for dysuria, urgency, frequency, hematuria, flank pain, vaginal discharge,  difficulty urinating and pelvic pain.       Incontinent  Musculoskeletal: Negative for myalgias, back pain, arthralgias, gait problem, neck pain and neck stiffness.       Generalized weakness  Skin: Negative for color change, pallor and rash.       Lesion mid forehead there was a basal cell cancer according to the daughter. The site of removal became infected, but is now doing better.  Allergic/Immunologic: Negative.   Neurological: Negative for dizziness, tremors, seizures, syncope, weakness, numbness and headaches.       Generalized muscular weakness. Recent stroke from septic emboli both cerebral hemispheres and cerebellar hemispheres.  Hematological: Negative for adenopathy. Does not bruise/bleed easily.  Psychiatric/Behavioral: Negative for suicidal ideas, hallucinations, behavioral problems, confusion, sleep disturbance, dysphoric mood and agitation. The patient is not nervous/anxious and is not hyperactive.     Filed Vitals:   06/16/14 1423  BP: 130/80  Pulse: 80  Temp: 98.4 F (36.9 C)  Resp: 16  Height: 5' (1.524 m)  Weight: 127 lb (57.607 kg)   Body mass index is 24.8 kg/(m^2).  Physical Exam  Constitutional: She is oriented to person, place, and  time. No distress.  Elderly frail female  HENT:  Right Ear: External ear normal.  Left Ear: External ear normal.  Nose: Nose normal.  Mouth/Throat: Oropharynx is clear and moist. No oropharyngeal exudate.  Eyes: Conjunctivae and EOM are normal. Pupils are equal, round, and reactive to light. Left eye exhibits discharge. No scleral icterus.  Neck: No JVD present. No tracheal deviation present. No thyromegaly present.  Cardiovascular: Normal rate.  Exam reveals no gallop and no friction rub.   Murmur (2/6 systolic ejection murmur along the left sternal border) heard. Atrial fibrillation with controlled rate. Diminished pedal pulses bilaterally.  Pulmonary/Chest: Effort normal. No respiratory distress. She has no wheezes. She has no rales. She exhibits no tenderness.  Abdominal: She exhibits no distension and no mass. There is no tenderness.  Musculoskeletal: Normal range of motion. She exhibits edema. She exhibits no tenderness.  Lymphadenopathy:    She has no cervical adenopathy.  Neurological: She is alert and oriented to person, place, and time. No cranial nerve deficit. She exhibits abnormal muscle tone. Coordination abnormal.  Skin: No rash noted. She is not diaphoretic. No erythema. No pallor.  Healing lesion at mid forehead which was the site of removal of a basal cell cancer.  Psychiatric: She has a normal mood and affect. Her behavior is normal. Judgment and thought content normal.     Labs reviewed: No visits with results within 3 Month(s) from this visit. Latest known visit with results is:  Admission on 06/13/2013, Discharged on 06/14/2013  Component Date Value Ref Range Status  . Digoxin Level 06/13/2013 0.9  0.8 - 2.0 ng/mL Final  . Sodium 06/13/2013 129* 137 - 147 mEq/L Final  . Potassium 06/13/2013 5.0  3.7 - 5.3 mEq/L Final  . Chloride 06/13/2013 92* 96 - 112 mEq/L Final  . CO2 06/13/2013 22  19 - 32 mEq/L Final  . Glucose, Bld 06/13/2013 145* 70 - 99 mg/dL Final    . BUN 06/13/2013 28* 6 - 23 mg/dL Final  . Creatinine, Ser 06/13/2013 1.00  0.50 - 1.10 mg/dL Final  . Calcium 06/13/2013 9.6  8.4 - 10.5 mg/dL Final  . GFR calc non Af Amer 06/13/2013 49* >90 mL/min Final  . GFR calc Af Amer 06/13/2013 57* >90 mL/min Final   Comment: (NOTE)  The eGFR has been calculated using the CKD EPI equation.                          This calculation has not been validated in all clinical situations.                          eGFR's persistently <90 mL/min signify possible Chronic Kidney                          Disease.  . WBC 06/13/2013 14.6* 4.0 - 10.5 K/uL Final  . RBC 06/13/2013 4.69  3.87 - 5.11 MIL/uL Final  . Hemoglobin 06/13/2013 13.9  12.0 - 15.0 g/dL Final  . HCT 06/13/2013 40.6  36.0 - 46.0 % Final  . MCV 06/13/2013 86.6  78.0 - 100.0 fL Final  . MCH 06/13/2013 29.6  26.0 - 34.0 pg Final  . MCHC 06/13/2013 34.2  30.0 - 36.0 g/dL Final  . RDW 06/13/2013 13.5  11.5 - 15.5 % Final  . Platelets 06/13/2013 237  150 - 400 K/uL Final  . Neutrophils Relative % 06/13/2013 92* 43 - 77 % Final  . Neutro Abs 06/13/2013 13.4* 1.7 - 7.7 K/uL Final  . Lymphocytes Relative 06/13/2013 5* 12 - 46 % Final  . Lymphs Abs 06/13/2013 0.8  0.7 - 4.0 K/uL Final  . Monocytes Relative 06/13/2013 3  3 - 12 % Final  . Monocytes Absolute 06/13/2013 0.4  0.1 - 1.0 K/uL Final  . Eosinophils Relative 06/13/2013 0  0 - 5 % Final  . Eosinophils Absolute 06/13/2013 0.0  0.0 - 0.7 K/uL Final  . Basophils Relative 06/13/2013 0  0 - 1 % Final  . Basophils Absolute 06/13/2013 0.0  0.0 - 0.1 K/uL Final  . Color, Urine 06/14/2013 YELLOW  YELLOW Final  . APPearance 06/14/2013 CLEAR  CLEAR Final  . Specific Gravity, Urine 06/14/2013 1.022  1.005 - 1.030 Final  . pH 06/14/2013 5.5  5.0 - 8.0 Final  . Glucose, UA 06/14/2013 NEGATIVE  NEGATIVE mg/dL Final  . Hgb urine dipstick 06/14/2013 NEGATIVE  NEGATIVE Final  . Bilirubin Urine 06/14/2013 NEGATIVE  NEGATIVE Final   . Ketones, ur 06/14/2013 NEGATIVE  NEGATIVE mg/dL Final  . Protein, ur 06/14/2013 NEGATIVE  NEGATIVE mg/dL Final  . Urobilinogen, UA 06/14/2013 0.2  0.0 - 1.0 mg/dL Final  . Nitrite 06/14/2013 NEGATIVE  NEGATIVE Final  . Leukocytes, UA 06/14/2013 TRACE* NEGATIVE Final  . Squamous Epithelial / LPF 06/14/2013 RARE  RARE Final  . WBC, UA 06/14/2013 7-10  <3 WBC/hpf Final  . RBC / HPF 06/14/2013 0-2  <3 RBC/hpf Final  . Bacteria, UA 06/14/2013 MANY* RARE Final  . Urine-Other 06/14/2013 MUCOUS PRESENT   Final  . Specimen Description 06/14/2013 URINE, CLEAN CATCH   Final  . Special Requests 06/14/2013 NONE   Final  . Culture  Setup Time 06/14/2013    Final                   Value:06/14/2013 09:05                         Performed at [facility]  . Colony Count 06/14/2013    Final                   Value:NO GROWTH  Performed at [facility]  . Culture 06/14/2013    Final                   Value:NO GROWTH                         Performed at [facility]  . Report Status 06/14/2013 06/14/2013 FINAL   Final       Assessment/Plan  1. Debility Recent hospitalizations have caused patient to be extremely weak. This is undoubtedly related to prolonged bed rest, but the septic emboli to the brain have also significantly contributed to her debility.  She is enrolled in physical therapy and occupational therapy and hopefully will regain some of her abilities in the future.  Overall prognosis is somewhat guarded and unpredictable.  2. Basal cell carcinoma of face Area on the forehead is healing.  3. History of fall Patient has not sustained an injury related falls. She is in chronic atrial fibrillation and a fall history caused her not to be anticoagulated.  4. Essential hypertension Controlled  5. Dysphagia Diet is thickened liquids and chopped foods  6. Endocarditis, bacterial, acute/subacute Continues on Rocephin for another couple  of weeks in order to complete the 4 week recommended course of therapy.  7. Coronary artery disease involving native coronary artery of native heart without angina pectoris Recent myocardial infarction. No current angina.  8. Stroke Septic emboli to both hemispheres  9. Heart murmur Patient may need follow-up echocardiogram in the future  10. NSTEMI (non-ST elevated myocardial infarction) Occurred January 2016. She remains on aspirin 325 mg daily. She also is on atorvastatin.  11. Chronic atrial fibrillation Rate controlled

## 2014-06-16 NOTE — Progress Notes (Signed)
Patient ID: Anne Armstrong, female   DOB: 11-Jun-1923, 81 y.o.   MRN: [id number]   06/13/14  Facility:  Nursing Home Location:  [place] Room Number: 407-P LEVEL OF CARE:  SNF (31)   Chief Complaint  Patient presents with  . Acute Visit    BLE lower extremity edema    HISTORY OF PRESENT ILLNESS:  This is an 81 year old female who complains of right foot pain. No erythema nor wounds noted on right foot. Noted patient to have BLE edema 2+. Daughter is at bedside and verbalized that patient has used Lasix before and was able to tolerate it.  PAST MEDICAL HISTORY:  Past Medical History  Diagnosis Date  . Gastro-esophageal reflux   . Arthralgia   . A-fib   . Osteoarthritis   . Pneumonia   . UTI (urinary tract infection)   . NSTEMI (non-ST elevated myocardial infarction) 05/26/14  . Heart murmur   . Stroke 05/26/14    Septic emboli to cerebral and cerebellar hemispheres  . SVT (supraventricular tachycardia)   . 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 CAD (coronary artery disease) 05/26/14  . Dysphagia 05/26/14  . Hypertension   . History of fall   . Basal cell carcinoma of face   . Debility     CURRENT MEDICATIONS: Reviewed per MAR/see medication list  Allergies  Allergen Reactions  . Iodine     Unknown reaction per MAR   . Macrobid [Nitrofurantoin]     Unknown reaction per MAR   . Novocain [Procaine]     Unknown reaction per MAR   . Penicillins     Unknown reaction per MAR   . Shellfish Allergy     Unknown reaction per MAR   . Sulfa Antibiotics     Unknown reaction per [facility]      REVIEW OF SYSTEMS:  GENERAL: no fever or chills  RESPIRATORY: no cough, SOB, DOE, wheezing, hemoptysis CARDIAC: no chest pain, or palpitations GI: no abdominal pain, diarrhea, constipation, heart burn, nausea or vomiting  PHYSICAL EXAMINATION  GENERAL: sleepy, arousable NECK: supple, trachea midline,  no neck masses, no thyroid tenderness, no thyromegaly LYMPHATICS: no LAN in the neck, no supraclavicular LAN RESPIRATORY: breathing is even & unlabored, BS rales on right lung field CARDIAC: RRR, no extra heart sounds,  BUE and BLE edema 2+ GI: abdomen soft, normal BS, no masses, no tenderness, no hepatomegaly, no splenomegaly EXTREMITIES:  Able to move BUE, does not move BLE; right upper arm double lumen PICC PSYCHIATRIC: the patient is alert & oriented to person, affect & behavior appropriate  LABS/RADIOLOGY: Labs reviewed: Basic Metabolic Panel:  Recent Labs  06/13/13 2345  NA 129*  K 5.0  CL 92*  CO2 22  GLUCOSE 145*  BUN 28*  CREATININE 1.00  CALCIUM 9.6   CBC:  Recent Labs  06/13/13 2345  WBC 14.6*  NEUTROABS 13.4*  HGB 13.9  HCT 40.6  MCV 86.6  PLT 237   05/26/14 na 130, k 4.6, cl 92, co2 28, glu 104, bun 18, cr 0.82, wbc 11, hb 11.2, hct 33.6, plt 318 06/09/14  WBC 6.5 hemoglobin 9.9 hematocrit 29.6 MCV 84.3 sodium 140 potassium 3.5 glucose 91 creatinine 0.63 alkaline phosphatase 106 SGOT 20 SGPT 16 total protein 4.8 albumin 2.5 calcium 7.9  ASSESSMENT/PLAN:  Bilateral lower extremity edema - start Lasix 20 mg 1 tab by mouth daily 5 days; BMP in 1 week;  air mattress oh per day to prevent skin breakdown    [facility], NP [place]

## 2014-07-02 ENCOUNTER — Inpatient Hospital Stay (HOSPITAL_COMMUNITY)
Admission: EM | Admit: 2014-07-02 | Discharge: 2014-07-05 | DRG: 177 | Disposition: A | Discharge status: Skilled Nursing Facility | Payer: Medicare Other | Attending: Internal Medicine | Admitting: Internal Medicine

## 2014-07-02 ENCOUNTER — Encounter (HOSPITAL_COMMUNITY): Payer: Self-pay | Admitting: Emergency Medicine

## 2014-07-02 ENCOUNTER — Emergency Department (HOSPITAL_COMMUNITY): Payer: Medicare Other

## 2014-07-02 DIAGNOSIS — R0602 Shortness of breath: Secondary | ICD-10-CM | POA: Insufficient documentation

## 2014-07-02 DIAGNOSIS — J690 Pneumonitis due to inhalation of food and vomit: Principal | ICD-10-CM | POA: Diagnosis present

## 2014-07-02 DIAGNOSIS — I4891 Unspecified atrial fibrillation: Secondary | ICD-10-CM | POA: Diagnosis present

## 2014-07-02 DIAGNOSIS — Y95 Nosocomial condition: Secondary | ICD-10-CM | POA: Diagnosis present

## 2014-07-02 DIAGNOSIS — I252 Old myocardial infarction: Secondary | ICD-10-CM

## 2014-07-02 DIAGNOSIS — Z8673 Personal history of transient ischemic attack (TIA), and cerebral infarction without residual deficits: Secondary | ICD-10-CM

## 2014-07-02 DIAGNOSIS — I10 Essential (primary) hypertension: Secondary | ICD-10-CM | POA: Diagnosis present

## 2014-07-02 DIAGNOSIS — J189 Pneumonia, unspecified organism: Secondary | ICD-10-CM | POA: Insufficient documentation

## 2014-07-02 DIAGNOSIS — I2510 Atherosclerotic heart disease of native coronary artery without angina pectoris: Secondary | ICD-10-CM | POA: Diagnosis present

## 2014-07-02 DIAGNOSIS — Z66 Do not resuscitate: Secondary | ICD-10-CM | POA: Diagnosis present

## 2014-07-02 DIAGNOSIS — R7989 Other specified abnormal findings of blood chemistry: Secondary | ICD-10-CM | POA: Diagnosis present

## 2014-07-02 DIAGNOSIS — Z85828 Personal history of other malignant neoplasm of skin: Secondary | ICD-10-CM

## 2014-07-02 DIAGNOSIS — Z7982 Long term (current) use of aspirin: Secondary | ICD-10-CM

## 2014-07-02 DIAGNOSIS — Z79899 Other long term (current) drug therapy: Secondary | ICD-10-CM

## 2014-07-02 DIAGNOSIS — Z95828 Presence of other vascular implants and grafts: Secondary | ICD-10-CM

## 2014-07-02 DIAGNOSIS — I339 Acute and subacute endocarditis, unspecified: Secondary | ICD-10-CM | POA: Diagnosis present

## 2014-07-02 DIAGNOSIS — I330 Acute and subacute infective endocarditis: Secondary | ICD-10-CM | POA: Diagnosis present

## 2014-07-02 DIAGNOSIS — F0390 Unspecified dementia without behavioral disturbance: Secondary | ICD-10-CM | POA: Diagnosis present

## 2014-07-02 DIAGNOSIS — T17908A Unspecified foreign body in respiratory tract, part unspecified causing other injury, initial encounter: Secondary | ICD-10-CM | POA: Diagnosis present

## 2014-07-02 DIAGNOSIS — K219 Gastro-esophageal reflux disease without esophagitis: Secondary | ICD-10-CM | POA: Diagnosis present

## 2014-07-02 DIAGNOSIS — R778 Other specified abnormalities of plasma proteins: Secondary | ICD-10-CM | POA: Diagnosis present

## 2014-07-02 LAB — CBC WITH DIFFERENTIAL/PLATELET
Basophils Absolute: 0 10*3/uL (ref 0.0–0.1)
Basophils Relative: 0 % (ref 0–1)
EOS ABS: 0.5 10*3/uL (ref 0.0–0.7)
EOS PCT: 4 % (ref 0–5)
HCT: 34 % — ABNORMAL LOW (ref 36.0–46.0)
Hemoglobin: 11.2 g/dL — ABNORMAL LOW (ref 12.0–15.0)
LYMPHS ABS: 0.7 10*3/uL (ref 0.7–4.0)
Lymphocytes Relative: 6 % — ABNORMAL LOW (ref 12–46)
MCH: 28.6 pg (ref 26.0–34.0)
MCHC: 32.9 g/dL (ref 30.0–36.0)
MCV: 86.7 fL (ref 78.0–100.0)
MONO ABS: 0.7 10*3/uL (ref 0.1–1.0)
Monocytes Relative: 6 % (ref 3–12)
NEUTROS PCT: 84 % — AB (ref 43–77)
Neutro Abs: 10.5 10*3/uL — ABNORMAL HIGH (ref 1.7–7.7)
PLATELETS: 292 10*3/uL (ref 150–400)
RBC: 3.92 MIL/uL (ref 3.87–5.11)
RDW: 15.6 % — ABNORMAL HIGH (ref 11.5–15.5)
WBC: 12.5 10*3/uL — ABNORMAL HIGH (ref 4.0–10.5)

## 2014-07-02 LAB — URINALYSIS, ROUTINE W REFLEX MICROSCOPIC
Bilirubin Urine: NEGATIVE
Glucose, UA: NEGATIVE mg/dL
Hgb urine dipstick: NEGATIVE
Ketones, ur: NEGATIVE mg/dL
Nitrite: NEGATIVE
Protein, ur: NEGATIVE mg/dL
Specific Gravity, Urine: 1.011 (ref 1.005–1.030)
Urobilinogen, UA: 0.2 mg/dL (ref 0.0–1.0)
pH: 7.5 (ref 5.0–8.0)

## 2014-07-02 LAB — URINE MICROSCOPIC-ADD ON

## 2014-07-02 LAB — I-STAT CG4 LACTIC ACID, ED: Lactic Acid, Venous: 1.43 mmol/L (ref 0.5–2.0)

## 2014-07-02 MED ORDER — SODIUM CHLORIDE 0.9 % IV SOLN
Freq: Once | INTRAVENOUS | Status: AC
  Administered 2014-07-02: 23:00:00 via INTRAVENOUS

## 2014-07-02 NOTE — ED Provider Notes (Signed)
CSN: [id number]     Arrival date & time 07/02/14  2222 History   First MD Initiated Contact with Patient 07/02/14 2225     Chief Complaint  Patient presents with  . Chest Pain     (Consider location/radiation/quality/duration/timing/severity/associated sxs/prior Treatment) HPI Comments: Patient brought to the ER for evaluation of multiple problems. Patient reportedly had been complaining of chest pain earlier today. She was with her son when she started complaining about severe pain in the left chest. This occurred when she was being repositioned in bed. She did have some tenderness to the chest wall when the pain began. Patient also was given Phenergan by the nursing home. It's unclear if she had nausea and vomiting or just nausea. Upon arrival to the ER, however, patient is extremely somnolent, likely secondary to Phenergan. She is not awake enough to answer any questions. Level V Caveat due to mental status change.  Patient is a 81 y.o. female presenting with chest pain.  Chest Pain   Past Medical History  Diagnosis Date  . Gastro-esophageal reflux   . Arthralgia   . A-fib   . Osteoarthritis   . Pneumonia   . UTI (urinary tract infection)   . NSTEMI (non-ST elevated myocardial infarction) 05/26/14  . Heart murmur   . Stroke 05/26/14    Septic emboli to cerebral and cerebellar hemispheres  . SVT (supraventricular tachycardia)   . 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 CAD (coronary artery disease) 05/26/14  . Dysphagia 05/26/14  . Hypertension   . History of fall   . Basal cell carcinoma of face   . Debility    History reviewed. No pertinent past surgical history. No family history on file. History  Substance Use Topics  . Smoking status: Never Smoker   . Smokeless tobacco: Never Used  . Alcohol Use: No   OB History    No data available     Review of Systems  Unable to perform ROS: Mental status change   Cardiovascular: Positive for chest pain.      Allergies  Iodine; Macrobid; Novocain; Penicillins; Shellfish allergy; and Sulfa antibiotics  Home Medications   Prior to Admission medications   Medication Sig Start Date End Date Taking? Authorizing Provider  aspirin 325 MG tablet Take 325 mg by mouth daily.   Yes Historical Provider, MD  atorvastatin (LIPITOR) 20 MG tablet Take 20 mg by mouth every evening.   Yes Historical Provider, MD  cefTRIAXone 2 g in dextrose 5 % 50 mL Inject 2 g into the vein daily. For 4 weeks   Yes Historical Provider, MD  gabapentin (NEURONTIN) 300 MG capsule Take 300 mg by mouth 3 (three) times daily.    Yes Historical Provider, MD  hydrALAZINE (APRESOLINE) 25 MG tablet Take 25 mg by mouth 3 (three) times daily.   Yes Historical Provider, MD  meclizine (ANTIVERT) 12.5 MG tablet Take 1 tablet (12.5 mg total) by mouth 3 (three) times daily as needed for dizziness or nausea. 06/14/13  Yes John L Molpus, MD  metoprolol tartrate (LOPRESSOR) 25 MG tablet Take 25 mg by mouth 2 (two) times daily.  06/26/12  Yes Historical Provider, MD  omeprazole (PRILOSEC) 20 MG capsule Take 20 mg by mouth daily.   Yes Historical Provider, MD  PROAIR HFA 108 (90 BASE) MCG/ACT inhaler Inhale 1 puff into the lungs every 6 (six) hours as needed for wheezing or shortness of breath.  03/09/13  Yes  Historical Provider, MD  traMADol (ULTRAM) 50 MG tablet Take 50 mg by mouth 4 (four) times daily as needed for moderate pain.  08/18/12  Yes Historical Provider, MD  meclizine (ANTIVERT) 25 MG tablet Take 25 mg by mouth 3 (three) times daily as needed for dizziness or nausea.     Historical Provider, MD  omeprazole (PRILOSEC) 40 MG capsule Take 40 mg by mouth daily before breakfast.  08/19/13   Historical Provider, MD   BP 127/93 mmHg  Pulse 75  Temp(Src) 99.7 F (37.6 C) (Rectal)  Resp 23  SpO2 100% Physical Exam  Constitutional: She appears well-developed and well-nourished. No distress.  HENT:   Head: Normocephalic and atraumatic.  Right Ear: Hearing normal.  Left Ear: Hearing normal.  Nose: Nose normal.  Mouth/Throat: Oropharynx is clear and moist and mucous membranes are normal.  Eyes: Conjunctivae and EOM are normal. Pupils are equal, round, and reactive to light.  Neck: Normal range of motion. Neck supple.  Cardiovascular: Regular rhythm, S1 normal and S2 normal.  Exam reveals no gallop and no friction rub.   No murmur heard. Pulmonary/Chest: Effort normal. No respiratory distress. She has decreased breath sounds. She has rhonchi. She exhibits no tenderness.  Abdominal: Soft. Normal appearance and bowel sounds are normal. There is no hepatosplenomegaly. There is no tenderness. There is no rebound, no guarding, no tenderness at McBurney's point and negative Murphy's sign. No hernia.  Musculoskeletal: Normal range of motion.  Neurological: She is alert. She has normal strength. She is disoriented. No cranial nerve deficit or sensory deficit. Coordination normal. GCS eye subscore is 4. GCS verbal subscore is 4. GCS motor subscore is 6.  Skin: Skin is warm, dry and intact. No rash noted. No cyanosis.  Psychiatric: She has a normal mood and affect. Her speech is normal and behavior is normal. Thought content normal.  Nursing note and vitals reviewed.   ED Course  Procedures (including critical care time) Labs Review Labs Reviewed  CBC WITH DIFFERENTIAL/PLATELET - Abnormal; Notable for the following:    WBC 12.5 (*)    Hemoglobin 11.2 (*)    HCT 34.0 (*)    RDW 15.6 (*)    Neutrophils Relative % 84 (*)    Neutro Abs 10.5 (*)    Lymphocytes Relative 6 (*)    All other components within normal limits  URINALYSIS, ROUTINE W REFLEX MICROSCOPIC - Abnormal; Notable for the following:    Leukocytes, UA TRACE (*)    All other components within normal limits  URINE CULTURE  CULTURE, BLOOD (ROUTINE X 2)  CULTURE, BLOOD (ROUTINE X 2)  URINE MICROSCOPIC-ADD ON  COMPREHENSIVE  METABOLIC PANEL  LIPASE, BLOOD  TROPONIN I  I-STAT CG4 LACTIC ACID, ED    Imaging Review Ct Head Wo Contrast  07/03/2014   CLINICAL DATA:  81 year old female with nausea and vomiting. History of stroke.  EXAM: CT HEAD WITHOUT CONTRAST  TECHNIQUE: Contiguous axial images were obtained from the base of the skull through the vertex without intravenous contrast.  COMPARISON:  Head CT 05/23/2014, brain MRI 05/24/2014.  FINDINGS: The previous multifocal small embolic infarcts on MRI are not well seen by CT. There is no intracranial hemorrhage. No subdural or extra-axial fluid collection. Generalized atrophy and chronic small vessel ischemic change, similar to prior exam. No midline shift. Bony calvarium is intact. Improved paranasal sinus disease compared to prior. Mild residual mucosal thickening is seen. Mastoid air cells are well aerated.  IMPRESSION: 1. Stable atrophy and chronic small  vessel ischemia. 2. The previous multiple small infarcts on prior MRI are not well seen by CT.   Electronically Signed   By: Jeb Levering M.D.   On: 07/03/2014 00:22   Dg Chest Port 1 View  07/02/2014   CLINICAL DATA:  Shortness of breath, chest pain. History of hypertension, atrial fibrillation, pneumonia, stroke.  EXAM: PORTABLE CHEST - 1 VIEW  COMPARISON:  05/29/2014  FINDINGS: Cardiac enlargement without significant vascular congestion or edema. Atelectasis in the lung bases. Can't exclude consolidation and small effusion on the left base. No pneumothorax. Right PICC line tip over the cavoatrial junction. Calcification of aorta. Post kyphoplasty changes in the thoracolumbar region. Degenerative changes in the spine and shoulders.  IMPRESSION: Cardiac enlargement. Atelectasis in the lung bases with possible focal consolidation and small effusion in the left base. Left basilar pneumonia not excluded.   Electronically Signed   By: Lucienne Capers M.D.   On: 07/02/2014 23:11     EKG Interpretation None       Date:  07/03/2014  Rate: 87  Rhythm: normal sinus rhythm  QRS Axis: left  Intervals: normal  ST/T Wave abnormalities: nonspecific ST/T changes  Conduction Disutrbances:right bundle branch block and left anterior fascicular block  Narrative Interpretation:   Old EKG Reviewed: none available    MDM   Final diagnoses:  Shortness of breath  HCAP (healthcare-associated pneumonia)    Patient presents to the ER initially for evaluation of chest pain. Patient had onset of a sharp and severe pain in the left side of her chest while at the nursing home. There was some reproducibility of this pain. EKG does not show acute abnormality.  Upon arrival to the ER, patient is extremely somnolent. She had been given a Phenergan suppository prior to arrival in the ER. This apparently has caused her to become significantly sedated. During her period of evaluation here in the ER, she has progressively improved with her mental status. It's not clear if she had vomiting earlier. Chest x-ray suspicious for pneumonia. Cannot rule out aspiration.  Patient has a complex recent past medical history including endocarditis. She is currently being treated with Rocephin daily. Patient treated with cefepime and vancomycin here in the ER for healthcare associated pneumonia, possible aspiration pneumonia. She will require hospitalization for further management. Patient is a DO NOT RESUSCITATE.   Orpah Greek, MD 07/03/14 [phone]

## 2014-07-02 NOTE — ED Notes (Signed)
Pt arrives via EMS from [facility]. Onset of sudden left sided chest ain while repositioning. 25 MG of phenergan, 4MG  zofran and tramadol on board. PICC line in place. Nauseated, no vomiting.

## 2014-07-02 NOTE — ED Notes (Signed)
Per son, PICC line placed at [facility], will need xray to confirm placement if admitted

## 2014-07-02 NOTE — ED Notes (Signed)
Hx of stroke in the last 5-6 weeks, recently txed for MRSA. Family at bedside

## 2014-07-03 ENCOUNTER — Inpatient Hospital Stay (HOSPITAL_COMMUNITY): Payer: Medicare Other

## 2014-07-03 DIAGNOSIS — I10 Essential (primary) hypertension: Secondary | ICD-10-CM | POA: Diagnosis not present

## 2014-07-03 DIAGNOSIS — J189 Pneumonia, unspecified organism: Secondary | ICD-10-CM | POA: Insufficient documentation

## 2014-07-03 DIAGNOSIS — I4891 Unspecified atrial fibrillation: Secondary | ICD-10-CM | POA: Diagnosis present

## 2014-07-03 DIAGNOSIS — R7989 Other specified abnormal findings of blood chemistry: Secondary | ICD-10-CM | POA: Diagnosis not present

## 2014-07-03 DIAGNOSIS — Z7982 Long term (current) use of aspirin: Secondary | ICD-10-CM | POA: Diagnosis not present

## 2014-07-03 DIAGNOSIS — T17998S Other foreign object in respiratory tract, part unspecified causing other injury, sequela: Secondary | ICD-10-CM | POA: Diagnosis not present

## 2014-07-03 DIAGNOSIS — R0602 Shortness of breath: Secondary | ICD-10-CM | POA: Diagnosis not present

## 2014-07-03 DIAGNOSIS — T17998A Other foreign object in respiratory tract, part unspecified causing other injury, initial encounter: Secondary | ICD-10-CM

## 2014-07-03 DIAGNOSIS — I288 Other diseases of pulmonary vessels: Secondary | ICD-10-CM

## 2014-07-03 DIAGNOSIS — T17908A Unspecified foreign body in respiratory tract, part unspecified causing other injury, initial encounter: Secondary | ICD-10-CM | POA: Diagnosis present

## 2014-07-03 DIAGNOSIS — Z85828 Personal history of other malignant neoplasm of skin: Secondary | ICD-10-CM | POA: Diagnosis not present

## 2014-07-03 DIAGNOSIS — J690 Pneumonitis due to inhalation of food and vomit: Principal | ICD-10-CM

## 2014-07-03 DIAGNOSIS — Z66 Do not resuscitate: Secondary | ICD-10-CM | POA: Diagnosis present

## 2014-07-03 DIAGNOSIS — Y95 Nosocomial condition: Secondary | ICD-10-CM | POA: Diagnosis present

## 2014-07-03 DIAGNOSIS — F0390 Unspecified dementia without behavioral disturbance: Secondary | ICD-10-CM | POA: Diagnosis present

## 2014-07-03 DIAGNOSIS — I252 Old myocardial infarction: Secondary | ICD-10-CM | POA: Diagnosis not present

## 2014-07-03 DIAGNOSIS — Z8673 Personal history of transient ischemic attack (TIA), and cerebral infarction without residual deficits: Secondary | ICD-10-CM | POA: Diagnosis not present

## 2014-07-03 DIAGNOSIS — T17998D Other foreign object in respiratory tract, part unspecified causing other injury, subsequent encounter: Secondary | ICD-10-CM | POA: Diagnosis not present

## 2014-07-03 DIAGNOSIS — I339 Acute and subacute endocarditis, unspecified: Secondary | ICD-10-CM | POA: Diagnosis present

## 2014-07-03 DIAGNOSIS — Z79899 Other long term (current) drug therapy: Secondary | ICD-10-CM | POA: Diagnosis not present

## 2014-07-03 DIAGNOSIS — K219 Gastro-esophageal reflux disease without esophagitis: Secondary | ICD-10-CM | POA: Diagnosis present

## 2014-07-03 DIAGNOSIS — I2510 Atherosclerotic heart disease of native coronary artery without angina pectoris: Secondary | ICD-10-CM | POA: Diagnosis present

## 2014-07-03 LAB — COMPREHENSIVE METABOLIC PANEL
ALT: 60 U/L — ABNORMAL HIGH (ref 0–35)
AST: 59 U/L — ABNORMAL HIGH (ref 0–37)
Albumin: 3.3 g/dL — ABNORMAL LOW (ref 3.5–5.2)
Alkaline Phosphatase: 127 U/L — ABNORMAL HIGH (ref 39–117)
Anion gap: 9 (ref 5–15)
BILIRUBIN TOTAL: 0.4 mg/dL (ref 0.3–1.2)
BUN: 21 mg/dL (ref 6–23)
CO2: 28 mmol/L (ref 19–32)
CREATININE: 0.81 mg/dL (ref 0.50–1.10)
Calcium: 9.1 mg/dL (ref 8.4–10.5)
Chloride: 96 mmol/L (ref 96–112)
GFR calc Af Amer: 72 mL/min — ABNORMAL LOW (ref >90)
GFR calc non Af Amer: 63 mL/min — ABNORMAL LOW (ref >90)
Glucose, Bld: 136 mg/dL — ABNORMAL HIGH (ref 70–99)
Potassium: 4.3 mmol/L (ref 3.5–5.1)
Sodium: 133 mmol/L — ABNORMAL LOW (ref 135–145)
TOTAL PROTEIN: 6.8 g/dL (ref 6.0–8.3)

## 2014-07-03 LAB — TROPONIN I
TROPONIN I: 0.04 ng/mL — AB (ref <0.031)
Troponin I: 0.06 ng/mL — ABNORMAL HIGH (ref <0.031)
Troponin I: 0.05 ng/mL — ABNORMAL HIGH (ref <0.031)
Troponin I: 0.06 ng/mL — ABNORMAL HIGH (ref <0.031)

## 2014-07-03 LAB — LIPASE, BLOOD: Lipase: 61 U/L — ABNORMAL HIGH (ref 11–59)

## 2014-07-03 MED ORDER — DEXTROSE 5 % IV SOLN: 2.0000 g | INTRAVENOUS | Status: DC

## 2014-07-03 MED ORDER — DEXTROSE 5 % IV SOLN
2.0000 g | INTRAVENOUS | Status: DC
  Administered 2014-07-03 – 2014-07-05 (×3): 2 g via INTRAVENOUS
  Filled 2014-07-03 (×3): qty 2

## 2014-07-03 MED ORDER — DEXTROSE 5 % IV SOLN
1.0000 g | Freq: Once | INTRAVENOUS | Status: AC
  Administered 2014-07-03: 1 g via INTRAVENOUS
  Filled 2014-07-03: qty 1

## 2014-07-03 MED ORDER — ONDANSETRON HCL 4 MG/2ML IJ SOLN
4.0000 mg | Freq: Once | INTRAMUSCULAR | Status: AC
  Administered 2014-07-03: 4 mg via INTRAVENOUS

## 2014-07-03 MED ORDER — HEPARIN SODIUM (PORCINE) 5000 UNIT/ML IJ SOLN
5000.0000 [IU] | Freq: Three times a day (TID) | INTRAMUSCULAR | Status: DC
  Administered 2014-07-03 – 2014-07-05 (×6): 5000 [IU] via SUBCUTANEOUS
  Filled 2014-07-03 (×6): qty 1

## 2014-07-03 MED ORDER — ONDANSETRON HCL 4 MG/2ML IJ SOLN
INTRAMUSCULAR | Status: AC
  Filled 2014-07-03: qty 2

## 2014-07-03 MED ORDER — VANCOMYCIN HCL IN DEXTROSE 1-5 GM/200ML-% IV SOLN
1000.0000 mg | Freq: Once | INTRAVENOUS | Status: AC
  Administered 2014-07-03: 1000 mg via INTRAVENOUS
  Filled 2014-07-03: qty 200

## 2014-07-03 MED ORDER — SODIUM CHLORIDE 0.9 % IJ SOLN
10.0000 mL | INTRAMUSCULAR | Status: DC | PRN
  Administered 2014-07-04: 10 mL
  Administered 2014-07-05: 20 mL
  Filled 2014-07-03 (×2): qty 40

## 2014-07-03 MED ORDER — SODIUM CHLORIDE 0.9 % IJ SOLN
10.0000 mL | INTRAMUSCULAR | Status: DC | PRN
  Administered 2014-07-03: 20 mL
  Administered 2014-07-03 – 2014-07-04 (×2): 10 mL
  Filled 2014-07-03 (×3): qty 40

## 2014-07-03 MED ORDER — ONDANSETRON HCL 4 MG/2ML IJ SOLN: 4.0000 mg | Freq: Four times a day (QID) | INTRAMUSCULAR | Status: DC | PRN

## 2014-07-03 NOTE — Evaluation (Signed)
Clinical/Bedside Swallow Evaluation Patient Details  Name: Anne Armstrong MRN: [id number] Date of Birth: 02-12-1924  Today's Date: 07/03/2014 Time: SLP Start Time (ACUTE ONLY): 0848 SLP Stop Time (ACUTE ONLY): 0919 SLP Time Calculation (min) (ACUTE ONLY): 31 min  Past Medical History:  Past Medical History  Diagnosis Date  . Gastro-esophageal reflux   . Arthralgia   . A-fib   . Osteoarthritis   . Pneumonia   . UTI (urinary tract infection)   . NSTEMI (non-ST elevated myocardial infarction) 05/26/14  . Heart murmur   . Stroke 05/26/14    Septic emboli to cerebral and cerebellar hemispheres  . SVT (supraventricular tachycardia)   . 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 CAD (coronary artery disease) 05/26/14  . Dysphagia 05/26/14  . Hypertension   . History of fall   . Basal cell carcinoma of face   . Debility    Past Surgical History: History reviewed. No pertinent past surgical history. HPI:  81 y.o. female with history of bacterial endocarditis, GERD, pna, CVA 05/26/14, dysphagia, HTN admitted with episode of possible aspiration event when son was brushing her teeth, and may have had additional aspiration due to lethargy caused by the phenergan.She developed chest pain and was brought to the ED. Daughter reports pt on thick it in January and "graduated" to thin liquids, coughs intermittently. CXR atelectasis in the lung bases with possible focal consolidation and small effusion in the left base. Left basilar pneumonia not excluded   Assessment / Plan / Recommendation Clinical Impression  Pt with history of dysphagia exhibited evidence of poor airway protection with strong cough following one of 5 sips water. MBS would provide visualization of current swallow status to guide treatment plan and safest diet/iquids/    Aspiration Risk  Moderate    Diet Recommendation NPO        Other  Recommendations Recommended Consults:  MBS Oral Care Recommendations: Oral care BID   Follow Up Recommendations   (TBD)    Frequency and Duration        Pertinent Vitals/Pain none         Swallow Study         Oral/Motor/Sensory Function Overall Oral Motor/Sensory Function: Appears within functional limits for tasks assessed   Ice Chips Ice chips: Not tested   Thin Liquid Thin Liquid: Impaired Presentation: Cup Pharyngeal  Phase Impairments: Cough - Immediate (audible swallow)    Nectar Thick Nectar Thick Liquid: Not tested   Honey Thick Honey Thick Liquid: Not tested   Puree Puree: Impaired Pharyngeal Phase Impairments:  (audible swallow)   Solid   GO    Solid: Not tested       Houston Siren 07/03/2014,9:31 AM   Orbie Pyo Colvin Caroli.Ed [employer] [phone]

## 2014-07-03 NOTE — Progress Notes (Signed)
    MBSS complete. Full report located under chart review in imaging section.    Erricka Falkner Willis Dray Dente M.Ed CCC-SLP Pager [phone]      

## 2014-07-03 NOTE — H&P (Signed)
[facility] History and Physical  Piper Hassebrock XNA:[id number] DOB: 05-20-1923 DOA: 07/02/2014  Referring physician: EDP PCP: Imagene Riches, MD   Chief Complaint: Aspiration, chest pain   HPI: Anne Armstrong is a 81 y.o. female with history of bacterial endocarditis treated at [facility] in January.  Discharged on rocephin to complete a 6 week course of ABx and actually nearly done with the rocephin (has about 1 week left or so).  Patient ate dinner tonight, developed nausea and was given phenergan at home.  Unfortunately phenergan made her minimally responsive.  Patient also had an aspiration event when son was brushing her teeth, and may have had additional aspiration due to lethargy caused by the phenergan.  She developed chest pain and was brought to the ED.  In the ED phenergan has started to wear off and patient is now able to wake up and answer questions.  Unfortunately she now has a new cough, and an indeterminate minimal opacity in the LLL on her CXR.  Review of Systems: Systems reviewed.  As above, otherwise negative  Past Medical History  Diagnosis Date  . Gastro-esophageal reflux   . Arthralgia   . A-fib   . Osteoarthritis   . Pneumonia   . UTI (urinary tract infection)   . NSTEMI (non-ST elevated myocardial infarction) 05/26/14  . Heart murmur   . Stroke 05/26/14    Septic emboli to cerebral and cerebellar hemispheres  . SVT (supraventricular tachycardia)   . 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 CAD (coronary artery disease) 05/26/14  . Dysphagia 05/26/14  . Hypertension   . History of fall   . Basal cell carcinoma of face   . Debility    History reviewed. No pertinent past surgical history. Social History:  reports that she has never smoked. She has never used smokeless tobacco. She reports that she does not drink alcohol or use illicit drugs.  Allergies  Allergen Reactions  . Iodine      Unknown reaction per MAR   . Macrobid [Nitrofurantoin]     Unknown reaction per MAR   . Novocain [Procaine]     Unknown reaction per MAR   . Penicillins     Unknown reaction per MAR   . Shellfish Allergy     Unknown reaction per MAR   . Sulfa Antibiotics     Unknown reaction per MAR     No family history on file.   Prior to Admission medications   Medication Sig Start Date End Date Taking? Authorizing Provider  aspirin 325 MG tablet Take 325 mg by mouth daily.   Yes Historical Provider, MD  atorvastatin (LIPITOR) 20 MG tablet Take 20 mg by mouth every evening.   Yes Historical Provider, MD  cefTRIAXone 2 g in dextrose 5 % 50 mL Inject 2 g into the vein daily. For 4 weeks   Yes Historical Provider, MD  gabapentin (NEURONTIN) 300 MG capsule Take 300 mg by mouth 3 (three) times daily.    Yes Historical Provider, MD  hydrALAZINE (APRESOLINE) 25 MG tablet Take 25 mg by mouth 3 (three) times daily.   Yes Historical Provider, MD  meclizine (ANTIVERT) 12.5 MG tablet Take 1 tablet (12.5 mg total) by mouth 3 (three) times daily as needed for dizziness or nausea. 06/14/13  Yes John L Molpus, MD  metoprolol tartrate (LOPRESSOR) 25 MG tablet Take 25 mg by mouth 2 (two) times daily.  06/26/12  Yes  Historical Provider, MD  omeprazole (PRILOSEC) 20 MG capsule Take 20 mg by mouth daily.   Yes Historical Provider, MD  PROAIR HFA 108 (90 BASE) MCG/ACT inhaler Inhale 1 puff into the lungs every 6 (six) hours as needed for wheezing or shortness of breath.  03/09/13  Yes Historical Provider, MD  traMADol (ULTRAM) 50 MG tablet Take 50 mg by mouth 4 (four) times daily as needed for moderate pain.  08/18/12  Yes Historical Provider, MD  meclizine (ANTIVERT) 25 MG tablet Take 25 mg by mouth 3 (three) times daily as needed for dizziness or nausea.     Historical Provider, MD  omeprazole (PRILOSEC) 40 MG capsule Take 40 mg by mouth daily before breakfast.  08/19/13   Historical Provider, MD   Physical Exam: Filed  Vitals:   07/03/14 0130  BP: 164/84  Pulse:   Temp:   Resp:     BP 164/84 mmHg  Pulse 80  Temp(Src) 99.7 F (37.6 C) (Rectal)  Resp 23  SpO2 99%  General Appearance:    Alert, oriented, no distress, appears stated age  Head:    Normocephalic, atraumatic  Eyes:    PERRL, EOMI, sclera non-icteric        Nose:   Nares without drainage or epistaxis. Mucosa, turbinates normal  Throat:   Moist mucous membranes. Oropharynx without erythema or exudate.  Neck:   Supple. No carotid bruits.  No thyromegaly.  No lymphadenopathy.   Back:     No CVA tenderness, no spinal tenderness  Lungs:     Clear to auscultation bilaterally, without wheezes, rhonchi or rales  Chest wall:    No tenderness to palpitation  Heart:    Regular rate and rhythm without murmurs, gallops, rubs  Abdomen:     Soft, non-tender, nondistended, normal bowel sounds, no organomegaly  Genitalia:    deferred  Rectal:    deferred  Extremities:   No clubbing, cyanosis or edema.  Pulses:   2+ and symmetric all extremities  Skin:   Skin color, texture, turgor normal, no rashes or lesions  Lymph nodes:   Cervical, supraclavicular, and axillary nodes normal  Neurologic:   CNII-XII intact. Normal strength, sensation and reflexes      throughout    Labs on Admission:  Basic Metabolic Panel:  Recent Labs Lab 07/02/14 2324  NA 133*  K 4.3  CL 96  CO2 28  GLUCOSE 136*  BUN 21  CREATININE 0.81  CALCIUM 9.1   Liver Function Tests:  Recent Labs Lab 07/02/14 2324  AST 59*  ALT 60*  ALKPHOS 127*  BILITOT 0.4  PROT 6.8  ALBUMIN 3.3*    Recent Labs Lab 07/02/14 2324  LIPASE 61*   No results for input(s): AMMONIA in the last 168 hours. CBC:  Recent Labs Lab 07/02/14 2324  WBC 12.5*  NEUTROABS 10.5*  HGB 11.2*  HCT 34.0*  MCV 86.7  PLT 292   Cardiac Enzymes:  Recent Labs Lab 07/02/14 2324  TROPONINI 0.06*    BNP (last 3 results) No results for input(s): PROBNP in the last 8760  hours. CBG: No results for input(s): GLUCAP in the last 168 hours.  Radiological Exams on Admission: Ct Head Wo Contrast  07/03/2014   CLINICAL DATA:  81 year old female with nausea and vomiting. History of stroke.  EXAM: CT HEAD WITHOUT CONTRAST  TECHNIQUE: Contiguous axial images were obtained from the base of the skull through the vertex without intravenous contrast.  COMPARISON:  Head CT 05/23/2014, brain  MRI 05/24/2014.  FINDINGS: The previous multifocal small embolic infarcts on MRI are not well seen by CT. There is no intracranial hemorrhage. No subdural or extra-axial fluid collection. Generalized atrophy and chronic small vessel ischemic change, similar to prior exam. No midline shift. Bony calvarium is intact. Improved paranasal sinus disease compared to prior. Mild residual mucosal thickening is seen. Mastoid air cells are well aerated.  IMPRESSION: 1. Stable atrophy and chronic small vessel ischemia. 2. The previous multiple small infarcts on prior MRI are not well seen by CT.   Electronically Signed   By: Jeb Levering M.D.   On: 07/03/2014 00:22   Dg Chest Port 1 View  07/02/2014   CLINICAL DATA:  Shortness of breath, chest pain. History of hypertension, atrial fibrillation, pneumonia, stroke.  EXAM: PORTABLE CHEST - 1 VIEW  COMPARISON:  05/29/2014  FINDINGS: Cardiac enlargement without significant vascular congestion or edema. Atelectasis in the lung bases. Can't exclude consolidation and small effusion on the left base. No pneumothorax. Right PICC line tip over the cavoatrial junction. Calcification of aorta. Post kyphoplasty changes in the thoracolumbar region. Degenerative changes in the spine and shoulders.  IMPRESSION: Cardiac enlargement. Atelectasis in the lung bases with possible focal consolidation and small effusion in the left base. Left basilar pneumonia not excluded.   Electronically Signed   By: Lucienne Capers M.D.   On: 07/02/2014 23:11    EKG: Independently  reviewed.  Assessment/Plan Principal Problem:   Aspiration into airway Active Problems:   Endocarditis, bacterial, acute/subacute   1. Aspiration pneumonitis - this early after aspiration event, patients lung findings are more likely to be aspiration pneumonitis than a full blown pneumonia.  Did get cefepime and vanc in the ED.  Will hold off on additional doses of vanc/cefepime for now, and just leave the patient on the 2gm daily rocephin IV that she is getting for endocarditis at home anyhow. 1. BCx pending 2. If she develops, fever or other SIRS, then would broaden ABx coverage. 2. Endocarditis 1. Continue rocephin 2. Sending for records from [facility] 3. Troponin ordered 4. Consider 2d echo, but even if she has valvular dysfunction, abscess, etc.  She would not be a candidate for surgery as discussed with son.  Patient has dementia, chronically ill, in NH, and is DNR/DNI at baseline anyhow.  I doubt she would survive valve replacement surgery at this point.  It sounds like this has already been discussed with son at [facility] as well.    Code Status: DNR/DNI  Family Communication: Family at bedside Disposition Plan: Admit to inpatient   Time spent: 70 min  GARDNER, JARED M. [facility] Pager [phone]  If 7AM-7PM, please contact the day team taking care of the patient [email or web address] Password [email or web address] 07/03/2014, 2:36 AM

## 2014-07-03 NOTE — Progress Notes (Signed)
Patient seen and evaluated earlier this a.m. but my associate. Please refer to his H&P for details regarding assessment and plan.  Patient was independently examined by myself.  Gen.: Patient in no acute distress alert and awake Cardiovascular: S1 and S2 within normal limits, no murmurs rubs or gallops Pulmonary: No increased work of breathing, nasal cannula in place, no wheezes, equal chest rise Musculoskeletal: No cyanosis on limited exam with normal tone  Discussed case with daughter at length and answered all of her questions to her satisfaction.  At this juncture plan will stay the same and will obtain speech therapy evaluation.  More than 50% of the time was spent discussing case with patient and daughter and answering their questions to their satisfaction.  We'll plan on reassessing next a.m.  Mazen Marcin, [employer]

## 2014-07-03 NOTE — ED Notes (Signed)
Attempted to give report 

## 2014-07-04 DIAGNOSIS — R778 Other specified abnormalities of plasma proteins: Secondary | ICD-10-CM | POA: Diagnosis present

## 2014-07-04 DIAGNOSIS — T17998D Other foreign object in respiratory tract, part unspecified causing other injury, subsequent encounter: Secondary | ICD-10-CM

## 2014-07-04 DIAGNOSIS — R7989 Other specified abnormal findings of blood chemistry: Secondary | ICD-10-CM

## 2014-07-04 LAB — CBC
HCT: 27.5 % — ABNORMAL LOW (ref 36.0–46.0)
HEMOGLOBIN: 9 g/dL — AB (ref 12.0–15.0)
MCH: 28.4 pg (ref 26.0–34.0)
MCHC: 32.7 g/dL (ref 30.0–36.0)
MCV: 86.8 fL (ref 78.0–100.0)
PLATELETS: 209 10*3/uL (ref 150–400)
RBC: 3.17 MIL/uL — AB (ref 3.87–5.11)
RDW: 15.5 % (ref 11.5–15.5)
WBC: 8 10*3/uL (ref 4.0–10.5)

## 2014-07-04 LAB — URINE CULTURE

## 2014-07-04 LAB — TROPONIN I: Troponin I: 0.07 ng/mL — ABNORMAL HIGH (ref <0.031)

## 2014-07-04 NOTE — Consult Note (Addendum)
Admit date: 07/02/2014 Referring Physician  Dr. Wendee Beavers Primary Physician  Dr. Imagene Riches Primary Cardiologist  None Reason for Consultation  Elevated troponin  HPI: Anne Armstrong is a 81 y.o. female with history of bacterial endocarditis treated at [facility] in January. Discharged on rocephin to complete a 6 week course of ABx and actually nearly done with the rocephin (has about 1 week left or so). Patient ate dinner last night, developed nausea and was given phenergan at home. Unfortunately phenergan made her minimally responsive. Patient also had an aspiration event when son was brushing her teeth, and may have had additional aspiration due to lethargy caused by the phenergan. She developed chest pain and was brought to the ED.  In the ED phenergan has started to wear off and patient is now able to wake up and answer questions. Unfortunately she now has a new cough, and an indeterminate minimal opacity in the LLL on her CXR.  This is felt to represent aspiration pneumonitis.  Cardiology is now asked to consult due to an elevated troponin noted on admission.  She denies any other episodes of chest pain other than the episode that occurred while her teeth were brushed.       PMH:   Past Medical History  Diagnosis Date  . Gastro-esophageal reflux   . Arthralgia   . A-fib   . Osteoarthritis   . Pneumonia   . UTI (urinary tract infection)   . NSTEMI (non-ST elevated myocardial infarction) 05/26/14  . Heart murmur   . Stroke 05/26/14    Septic emboli to cerebral and cerebellar hemispheres  . SVT (supraventricular tachycardia)   . 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 CAD (coronary artery disease) 05/26/14  . Dysphagia 05/26/14  . Hypertension   . History of fall   . Basal cell carcinoma of face   . Debility      PSH:  History reviewed. No pertinent past surgical history.  Allergies:  Iodine; Macrobid;  Novocain; Penicillins; Shellfish allergy; and Sulfa antibiotics Prior to Admit Meds:   Prescriptions prior to admission  Medication Sig Dispense Refill Last Dose  . aspirin 325 MG tablet Take 325 mg by mouth daily.   unk  . atorvastatin (LIPITOR) 20 MG tablet Take 20 mg by mouth every evening.   unk  . cefTRIAXone 2 g in dextrose 5 % 50 mL Inject 2 g into the vein daily. For 4 weeks   unk  . gabapentin (NEURONTIN) 300 MG capsule Take 300 mg by mouth 3 (three) times daily.    unk  . hydrALAZINE (APRESOLINE) 25 MG tablet Take 25 mg by mouth 3 (three) times daily.   unk  . meclizine (ANTIVERT) 12.5 MG tablet Take 1 tablet (12.5 mg total) by mouth 3 (three) times daily as needed for dizziness or nausea. 15 tablet 0 unk  . metoprolol tartrate (LOPRESSOR) 25 MG tablet Take 25 mg by mouth 2 (two) times daily.    unk at 0900 & 1700  . omeprazole (PRILOSEC) 20 MG capsule Take 20 mg by mouth daily.   unk  . PROAIR HFA 108 (90 BASE) MCG/ACT inhaler Inhale 1 puff into the lungs every 6 (six) hours as needed for wheezing or shortness of breath.    unk  . traMADol (ULTRAM) 50 MG tablet Take 50 mg by mouth 4 (four) times daily as needed for moderate pain.    unk  . meclizine (  ANTIVERT) 25 MG tablet Take 25 mg by mouth 3 (three) times daily as needed for dizziness or nausea.    unk  . omeprazole (PRILOSEC) 40 MG capsule Take 40 mg by mouth daily before breakfast.    05/27/2014 at [place] HX:   No family history on file. Social HX:    History   Social History  . Marital Status: Married    Spouse Name: N/A  . Number of Children: N/A  . Years of Education: N/A   Occupational History  . Not on file.   Social History Main Topics  . Smoking status: Never Smoker   . Smokeless tobacco: Never Used  . Alcohol Use: No  . Drug Use: No  . Sexual Activity: Not on file   Other Topics Concern  . Not on file   Social History Narrative     ROS:  All 11 ROS were addressed and are negative except what is  stated in the HPI  Physical Exam: Blood pressure 156/67, pulse 91, temperature 98 F (36.7 C), temperature source Axillary, resp. rate 16, SpO2 97 %.    General: Well developed, well nourished, in no acute distress Head: Eyes PERRLA, No xanthomas.   Normal cephalic and atramatic  Lungs:   Clear bilaterally to auscultation and percussion. Heart:   HRRR S1 S2 Pulses are 2+ & equal. 2/6 holosystolic murmur at LLSB to apex            No carotid bruit. No JVD.  No abdominal bruits. No femoral bruits. Abdomen: Bowel sounds are positive, abdomen soft and non-tender without masses  Extremities:   No clubbing, cyanosis or edema.  DP +1 Neuro: Alert and oriented X 3. Psych:  Good affect, responds appropriately    Labs:   Lab Results  Component Value Date   WBC 8.0 07/04/2014   HGB 9.0* 07/04/2014   HCT 27.5* 07/04/2014   MCV 86.8 07/04/2014   PLT 209 07/04/2014    Recent Labs Lab 07/02/14 2324  NA 133*  K 4.3  CL 96  CO2 28  BUN 21  CREATININE 0.81  CALCIUM 9.1  PROT 6.8  BILITOT 0.4  ALKPHOS 127*  ALT 60*  AST 59*  GLUCOSE 136*   No results found for: PTT No results found for: INR, PROTIME Lab Results  Component Value Date   TROPONINI 0.06* 07/03/2014    No results found for: CHOL No results found for: HDL No results found for: LDLCALC No results found for: TRIG No results found for: CHOLHDL No results found for: LDLDIRECT    Radiology:  Ct Head Wo Contrast  07/03/2014   CLINICAL DATA:  81 year old female with nausea and vomiting. History of stroke.  EXAM: CT HEAD WITHOUT CONTRAST  TECHNIQUE: Contiguous axial images were obtained from the base of the skull through the vertex without intravenous contrast.  COMPARISON:  Head CT 05/23/2014, brain MRI 05/24/2014.  FINDINGS: The previous multifocal small embolic infarcts on MRI are not well seen by CT. There is no intracranial hemorrhage. No subdural or extra-axial fluid collection. Generalized atrophy and chronic small  vessel ischemic change, similar to prior exam. No midline shift. Bony calvarium is intact. Improved paranasal sinus disease compared to prior. Mild residual mucosal thickening is seen. Mastoid air cells are well aerated.  IMPRESSION: 1. Stable atrophy and chronic small vessel ischemia. 2. The previous multiple small infarcts on prior MRI are not well seen by CT.   Electronically Signed   By: Threasa Beards  Ehinger M.D.   On: 07/03/2014 00:22   Dg Chest Port 1 View  07/02/2014   CLINICAL DATA:  Shortness of breath, chest pain. History of hypertension, atrial fibrillation, pneumonia, stroke.  EXAM: PORTABLE CHEST - 1 VIEW  COMPARISON:  05/29/2014  FINDINGS: Cardiac enlargement without significant vascular congestion or edema. Atelectasis in the lung bases. Can't exclude consolidation and small effusion on the left base. No pneumothorax. Right PICC line tip over the cavoatrial junction. Calcification of aorta. Post kyphoplasty changes in the thoracolumbar region. Degenerative changes in the spine and shoulders.  IMPRESSION: Cardiac enlargement. Atelectasis in the lung bases with possible focal consolidation and small effusion in the left base. Left basilar pneumonia not excluded.   Electronically Signed   By: Lucienne Capers M.D.   On: 07/02/2014 23:11   Dg Swallowing Func-speech Pathology  07/03/2014    Objective Swallowing Evaluation:    Patient Details  Name: Beckey Polkowski MRN: [id number] Date of Birth: [birth year]  Today's Date: 07/03/2014 Time: SLP Start Time (ACUTE ONLY): 1110-SLP Stop Time (ACUTE ONLY): 1131 SLP Time Calculation (min) (ACUTE ONLY): 21 min  Past Medical History:  Past Medical History  Diagnosis Date  . Gastro-esophageal reflux   . Arthralgia   . A-fib   . Osteoarthritis   . Pneumonia   . UTI (urinary tract infection)   . NSTEMI (non-ST elevated myocardial infarction) 05/26/14  . Heart murmur   . Stroke 05/26/14    Septic emboli to cerebral and cerebellar hemispheres  . SVT (supraventricular tachycardia)    . 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 CAD (coronary artery disease) 05/26/14  . Dysphagia 05/26/14  . Hypertension   . History of fall   . Basal cell carcinoma of face   . Debility    Past Surgical History: No past surgical history on file. HPI:  HPI: 81 y.o. female with history of bacterial endocarditis, GERD, pna, CVA  05/26/14, dysphagia, HTN admitted with episode of possible aspiration event  when son was brushing her teeth, and may have had additional aspiration  due to lethargy caused by the phenergan.She developed chest pain and was  brought to the ED. Daughter reports pt on thick it in January and  "graduated" to thin liquids, coughs intermittently. CXR atelectasis in the  lung bases with possible focal consolidation and small effusion in the  left base. Left basilar pneumonia not excluded.  BSE recommened MBS.  No Data Recorded  Assessment / Plan / Recommendation CHL IP CLINICAL IMPRESSIONS 07/03/2014  Dysphagia Diagnosis Mild pharyngeal phase dysphagia  Clinical impression Pt exhibited mild pharyngeal dysphagia primarily  isolated to use of straws with thin liquids resulting in sensed  aspiration. Cups sips thin were swallowed safely. Slight hesitation at  posterior tongue, however initiation of swallow was within functional  limits. Oral prep, mastication and transit functional with regular  texture. Recommend regular texture/thin liquids, no straws, pills with  liquid, small sips, sit upright. Brief follow up once to ensure safety and  efficiency.      CHL IP TREATMENT RECOMMENDATION 07/03/2014  Treatment Plan Recommendations Therapy as outlined in treatment plan below      CHL IP DIET RECOMMENDATION 07/03/2014  Diet Recommendations Regular;Thin liquid  Liquid Administration via Cup;No straw  Medication Administration Whole meds with liquid  Compensations Small sips/bites;Slow rate  Postural Changes and/or Swallow Maneuvers Seated upright 90 degrees      CHL IP OTHER RECOMMENDATIONS 07/03/2014  Recommended Consults (None)  Oral Care Recommendations Oral care BID  Other Recommendations (None)     CHL IP FOLLOW UP RECOMMENDATIONS 07/03/2014  Follow up Recommendations None     CHL IP FREQUENCY AND DURATION 07/03/2014  Speech Therapy Frequency (ACUTE ONLY) min 1 x/week  Treatment Duration 1 week     Pertinent Vitals/Pain none    SLP Swallow Goals No flowsheet data found.  No flowsheet data found.    CHL IP REASON FOR REFERRAL 07/03/2014  Reason for Referral Objectively evaluate swallowing function     CHL IP ORAL PHASE 07/03/2014  Lips (None)  Tongue (None)  Mucous membranes (None)  Nutritional status (None)  Other (None)  Oxygen therapy (None)  Oral Phase WFL  Oral - Pudding Teaspoon (None)  Oral - Pudding Cup (None)  Oral - Honey Teaspoon (None)  Oral - Honey Cup (None)  Oral - Honey Syringe (None)  Oral - Nectar Teaspoon (None)  Oral - Nectar Cup (None)  Oral - Nectar Straw (None)  Oral - Nectar Syringe (None)  Oral - Ice Chips (None)  Oral - Thin Teaspoon (None)  Oral - Thin Cup (None)  Oral - Thin Straw (None)  Oral - Thin Syringe (None)  Oral - Puree (None)  Oral - Mechanical Soft (None)  Oral - Regular (None)  Oral - Multi-consistency (None)  Oral - Pill (None)  Oral Phase - Comment (None)      CHL IP PHARYNGEAL PHASE 07/03/2014  Pharyngeal Phase Impaired  Pharyngeal - Pudding Teaspoon (None)  Penetration/Aspiration details (pudding teaspoon) (None)  Pharyngeal - Pudding Cup (None)  Penetration/Aspiration details (pudding cup) (None)  Pharyngeal - Honey Teaspoon (None)  Penetration/Aspiration details (honey teaspoon) (None)  Pharyngeal - Honey Cup (None)  Penetration/Aspiration details (honey cup) (None)  Pharyngeal - Honey Syringe (None)  Penetration/Aspiration details (honey syringe) (None)  Pharyngeal - Nectar Teaspoon WFL  Penetration/Aspiration details (nectar teaspoon) (None)  Pharyngeal - Nectar Cup [facility]  Penetration/Aspiration details (nectar cup) (None)  Pharyngeal -  Nectar Straw (None)  Penetration/Aspiration details (nectar straw) (None)  Pharyngeal - Nectar Syringe (None)  Penetration/Aspiration details (nectar syringe) (None)  Pharyngeal - Ice Chips (None)  Penetration/Aspiration details (ice chips) (None)  Pharyngeal - Thin Teaspoon WFL  Penetration/Aspiration details (thin teaspoon) (None)  Pharyngeal - Thin Cup [facility]  Penetration/Aspiration details (thin cup) (None)  Pharyngeal - Thin Straw Penetration/Aspiration during swallow;Reduced  airway/laryngeal closure  Penetration/Aspiration details (thin straw) Material enters airway, passes  BELOW cords and not ejected out despite cough attempt by patient  Pharyngeal - Thin Syringe (None)  Penetration/Aspiration details (thin syringe') (None)  Pharyngeal - Puree (None)  Penetration/Aspiration details (puree) (None)  Pharyngeal - Mechanical Soft (None)  Penetration/Aspiration details (mechanical soft) (None)  Pharyngeal - Regular WFL  Penetration/Aspiration details (regular) (None)  Pharyngeal - Multi-consistency (None)  Penetration/Aspiration details (multi-consistency) (None)  Pharyngeal - Pill (None)  Penetration/Aspiration details (pill) (None)  Pharyngeal Comment (None)     CHL IP CERVICAL ESOPHAGEAL PHASE 07/03/2014  Cervical Esophageal Phase WFL  Pudding Teaspoon (None)  Pudding Cup (None)  Honey Teaspoon (None)  Honey Cup (None)  Honey Syringe (None)  Nectar Teaspoon (None)  Nectar Cup (None)  Nectar Straw (None)  Nectar Syringe (None)  Thin Teaspoon (None)  Thin Cup (None)  Thin Straw (None)  Thin Syringe (None)  Cervical Esophageal Comment (None)    No flowsheet data found.         Houston Siren  07/03/2014, 1:44 PM   Orbie Pyo Colvin Caroli.Ed [employer] [phone]  EKG:  NSR with RBBB  ASSESSMENT:  1.  Elevated troponin with flat trend most likely represents demand ischemia in the setting of acute aspiration event.  She had chest pain after a presumed aspiration event and has not had any further CP and  denies CP prior to the event.  Would cycle one more trop to see if it has peaked.  Given her advanced age, dementia in a NH and DNR would not pursue further cardiac workup at this time. 2.  Acute aspiration pneumonitis - per IM 3.  Recent acute bacterial endocarditis at [facility] on Rocephin and will complete a 6 week course in 1 week - she is afebrile with normal WBC.  She does have a loud systolic murmur at apex c/w MR.  Please get echo report from [facility]   Sueanne Margarita, MD  07/04/2014  10:25 AM

## 2014-07-04 NOTE — Progress Notes (Signed)
[facility] PROGRESS NOTE  Anne Armstrong IOE:[id number] DOB: 1923-08-24 DOA: 07/02/2014 PCP: Imagene Riches, MD  Brief narrative: 81 year old with history of endocarditis on Rocephin and presented to the hospital after shortness of breath and cough. Currently presumed to be secondary to aspiration  Assessment/Plan: Principal Problem:   Aspiration into airway - Speech therapy evaluation - Currently on Rocephin for history of endocarditis but improving on this antibiotic as such will continue  Active Problems:   Endocarditis, bacterial, acute/subacute - continue rocephin    Shortness of breath - from principle problem, resolving. Patient reports feeling better    Elevated troponin - Consulted cardiology who recommended obtaining another troponin level and echocardiogram. Although suspicion is that this is most likely secondary to aspiration  Code Status: DO NOT RESUSCITATE Family Communication: Discussed yesterday with daughter but discussed directly with patient  Disposition Plan: Pending further workup   Consultants:  Cardiology  Procedures:  none  Antibiotics:  Rocephin   HPI/Subjective: Patient has no new complaints reports feeling better  Objective: Filed Vitals:   07/04/14 1519  BP: 148/77  Pulse: 88  Temp: 98.1 F (36.7 C)  Resp: 15    Intake/Output Summary (Last 24 hours) at 07/04/14 1721 Last data filed at 07/04/14 1500  Gross per 24 hour  Intake    320 ml  Output      0 ml  Net    320 ml   There were no vitals filed for this visit.  Exam:   General:  Patient in no acute distress, alert and awake  Cardiovascular: Regular rate and rhythm, no rubs  Respiratory: Nasal cannula in place, patient breathing comfortably, equal chest rise, no wheezes  Abdomen: Soft, nondistended, nontender  Musculoskeletal: No cyanosis or clubbing on limited exam   Data Reviewed: Basic Metabolic Panel:  Recent Labs Lab 07/02/14 2324  NA 133*  K 4.3  CL  96  CO2 28  GLUCOSE 136*  BUN 21  CREATININE 0.81  CALCIUM 9.1   Liver Function Tests:  Recent Labs Lab 07/02/14 2324  AST 59*  ALT 60*  ALKPHOS 127*  BILITOT 0.4  PROT 6.8  ALBUMIN 3.3*    Recent Labs Lab 07/02/14 2324  LIPASE 61*   No results for input(s): AMMONIA in the last 168 hours. CBC:  Recent Labs Lab 07/02/14 2324 07/04/14 0445  WBC 12.5* 8.0  NEUTROABS 10.5*  --   HGB 11.2* 9.0*  HCT 34.0* 27.5*  MCV 86.7 86.8  PLT 292 209   Cardiac Enzymes:  Recent Labs Lab 07/02/14 2324 07/03/14 0338 07/03/14 0820 07/03/14 1440  TROPONINI 0.06* 0.05* 0.04* 0.06*   BNP (last 3 results) No results for input(s): BNP in the last 8760 hours.  ProBNP (last 3 results) No results for input(s): PROBNP in the last 8760 hours.  CBG: No results for input(s): GLUCAP in the last 168 hours.  Recent Results (from the past 240 hour(s))  Urine culture     Status: None   Collection Time: 07/02/14 10:42 PM  Result Value Ref Range Status   Specimen Description URINE, CATHETERIZED  Final   Special Requests NONE  Final   Colony Count   Final    50,000 COLONIES/ML Performed at [facility]    Culture YEAST Performed at [facility]   Final   Report Status 07/04/2014 FINAL  Final  Culture, blood (routine x 2)     Status: None (Preliminary result)   Collection Time: 07/02/14 11:09 PM  Result Value Ref  Range Status   Specimen Description BLOOD LEFT FOREARM  Final   Special Requests BOTTLES DRAWN AEROBIC AND ANAEROBIC 5CC EA  Final   Culture   Final           BLOOD CULTURE RECEIVED NO GROWTH TO DATE CULTURE WILL BE HELD FOR 5 DAYS BEFORE ISSUING A FINAL NEGATIVE REPORT Performed at [facility]    Report Status PENDING  Incomplete  Culture, blood (routine x 2)     Status: None (Preliminary result)   Collection Time: 07/02/14 11:22 PM  Result Value Ref Range Status   Specimen Description BLOOD LEFT HAND  Final   Special Requests BOTTLES  DRAWN AEROBIC ONLY 5CC  Final   Culture   Final           BLOOD CULTURE RECEIVED NO GROWTH TO DATE CULTURE WILL BE HELD FOR 5 DAYS BEFORE ISSUING A FINAL NEGATIVE REPORT Performed at [facility]    Report Status PENDING  Incomplete     Studies: Ct Head Wo Contrast  07/03/2014   CLINICAL DATA:  81 year old female with nausea and vomiting. History of stroke.  EXAM: CT HEAD WITHOUT CONTRAST  TECHNIQUE: Contiguous axial images were obtained from the base of the skull through the vertex without intravenous contrast.  COMPARISON:  Head CT 05/23/2014, brain MRI 05/24/2014.  FINDINGS: The previous multifocal small embolic infarcts on MRI are not well seen by CT. There is no intracranial hemorrhage. No subdural or extra-axial fluid collection. Generalized atrophy and chronic small vessel ischemic change, similar to prior exam. No midline shift. Bony calvarium is intact. Improved paranasal sinus disease compared to prior. Mild residual mucosal thickening is seen. Mastoid air cells are well aerated.  IMPRESSION: 1. Stable atrophy and chronic small vessel ischemia. 2. The previous multiple small infarcts on prior MRI are not well seen by CT.   Electronically Signed   By: Jeb Levering M.D.   On: 07/03/2014 00:22   Dg Chest Port 1 View  07/02/2014   CLINICAL DATA:  Shortness of breath, chest pain. History of hypertension, atrial fibrillation, pneumonia, stroke.  EXAM: PORTABLE CHEST - 1 VIEW  COMPARISON:  05/29/2014  FINDINGS: Cardiac enlargement without significant vascular congestion or edema. Atelectasis in the lung bases. Can't exclude consolidation and small effusion on the left base. No pneumothorax. Right PICC line tip over the cavoatrial junction. Calcification of aorta. Post kyphoplasty changes in the thoracolumbar region. Degenerative changes in the spine and shoulders.  IMPRESSION: Cardiac enlargement. Atelectasis in the lung bases with possible focal consolidation and small effusion in the left  base. Left basilar pneumonia not excluded.   Electronically Signed   By: Lucienne Capers M.D.   On: 07/02/2014 23:11   Dg Swallowing Func-speech Pathology  07/03/2014    Objective Swallowing Evaluation:    Patient Details  Name: Anne Armstrong MRN: [id number] Date of Birth: [birth year]  Today's Date: 07/03/2014 Time: SLP Start Time (ACUTE ONLY): 1110-SLP Stop Time (ACUTE ONLY): 1131 SLP Time Calculation (min) (ACUTE ONLY): 21 min  Past Medical History:  Past Medical History  Diagnosis Date  . Gastro-esophageal reflux   . Arthralgia   . A-fib   . Osteoarthritis   . Pneumonia   . UTI (urinary tract infection)   . NSTEMI (non-ST elevated myocardial infarction) 05/26/14  . Heart murmur   . Stroke 05/26/14    Septic emboli to cerebral and cerebellar hemispheres  . SVT (supraventricular tachycardia)   . Endocarditis, bacterial, acute/subacute 05/26/14  Strep viridans; septic emboli to brain  . CAD (coronary artery disease) 05/26/14  . Dysphagia 05/26/14  . Hypertension   . History of fall   . Basal cell carcinoma of face   . Debility    Past Surgical History: No past surgical history on file. HPI:  HPI: 81 y.o. female with history of bacterial endocarditis, GERD, pna, CVA  05/26/14, dysphagia, HTN admitted with episode of possible aspiration event  when son was brushing her teeth, and may have had additional aspiration  due to lethargy caused by the phenergan.She developed chest pain and was  brought to the ED. Daughter reports pt on thick it in January and  "graduated" to thin liquids, coughs intermittently. CXR atelectasis in the  lung bases with possible focal consolidation and small effusion in the  left base. Left basilar pneumonia not excluded.  BSE recommened MBS.  No Data Recorded  Assessment / Plan / Recommendation CHL IP CLINICAL IMPRESSIONS 07/03/2014  Dysphagia Diagnosis Mild pharyngeal phase dysphagia  Clinical impression Pt exhibited mild pharyngeal dysphagia primarily  isolated to use of straws with thin liquids  resulting in sensed  aspiration. Cups sips thin were swallowed safely. Slight hesitation at  posterior tongue, however initiation of swallow was within functional  limits. Oral prep, mastication and transit functional with regular  texture. Recommend regular texture/thin liquids, no straws, pills with  liquid, small sips, sit upright. Brief follow up once to ensure safety and  efficiency.      CHL IP TREATMENT RECOMMENDATION 07/03/2014  Treatment Plan Recommendations Therapy as outlined in treatment plan below      CHL IP DIET RECOMMENDATION 07/03/2014  Diet Recommendations Regular;Thin liquid  Liquid Administration via Cup;No straw  Medication Administration Whole meds with liquid  Compensations Small sips/bites;Slow rate  Postural Changes and/or Swallow Maneuvers Seated upright 90 degrees     CHL IP OTHER RECOMMENDATIONS 07/03/2014  Recommended Consults (None)  Oral Care Recommendations Oral care BID  Other Recommendations (None)     CHL IP FOLLOW UP RECOMMENDATIONS 07/03/2014  Follow up Recommendations None     CHL IP FREQUENCY AND DURATION 07/03/2014  Speech Therapy Frequency (ACUTE ONLY) min 1 x/week  Treatment Duration 1 week     Pertinent Vitals/Pain none    SLP Swallow Goals No flowsheet data found.  No flowsheet data found.    CHL IP REASON FOR REFERRAL 07/03/2014  Reason for Referral Objectively evaluate swallowing function     CHL IP ORAL PHASE 07/03/2014  Lips (None)  Tongue (None)  Mucous membranes (None)  Nutritional status (None)  Other (None)  Oxygen therapy (None)  Oral Phase WFL  Oral - Pudding Teaspoon (None)  Oral - Pudding Cup (None)  Oral - Honey Teaspoon (None)  Oral - Honey Cup (None)  Oral - Honey Syringe (None)  Oral - Nectar Teaspoon (None)  Oral - Nectar Cup (None)  Oral - Nectar Straw (None)  Oral - Nectar Syringe (None)  Oral - Ice Chips (None)  Oral - Thin Teaspoon (None)  Oral - Thin Cup (None)  Oral - Thin Straw (None)  Oral - Thin Syringe (None)  Oral - Puree (None)  Oral - Mechanical Soft (None)   Oral - Regular (None)  Oral - Multi-consistency (None)  Oral - Pill (None)  Oral Phase - Comment (None)      CHL IP PHARYNGEAL PHASE 07/03/2014  Pharyngeal Phase Impaired  Pharyngeal - Pudding Teaspoon (None)  Penetration/Aspiration details (pudding teaspoon) (None)  Pharyngeal - Pudding Cup (None)  Penetration/Aspiration details (pudding cup) (None)  Pharyngeal - Honey Teaspoon (None)  Penetration/Aspiration details (honey teaspoon) (None)  Pharyngeal - Honey Cup (None)  Penetration/Aspiration details (honey cup) (None)  Pharyngeal - Honey Syringe (None)  Penetration/Aspiration details (honey syringe) (None)  Pharyngeal - Nectar Teaspoon WFL  Penetration/Aspiration details (nectar teaspoon) (None)  Pharyngeal - Nectar Cup [facility]  Penetration/Aspiration details (nectar cup) (None)  Pharyngeal - Nectar Straw (None)  Penetration/Aspiration details (nectar straw) (None)  Pharyngeal - Nectar Syringe (None)  Penetration/Aspiration details (nectar syringe) (None)  Pharyngeal - Ice Chips (None)  Penetration/Aspiration details (ice chips) (None)  Pharyngeal - Thin Teaspoon WFL  Penetration/Aspiration details (thin teaspoon) (None)  Pharyngeal - Thin Cup [facility]  Penetration/Aspiration details (thin cup) (None)  Pharyngeal - Thin Straw Penetration/Aspiration during swallow;Reduced  airway/laryngeal closure  Penetration/Aspiration details (thin straw) Material enters airway, passes  BELOW cords and not ejected out despite cough attempt by patient  Pharyngeal - Thin Syringe (None)  Penetration/Aspiration details (thin syringe') (None)  Pharyngeal - Puree (None)  Penetration/Aspiration details (puree) (None)  Pharyngeal - Mechanical Soft (None)  Penetration/Aspiration details (mechanical soft) (None)  Pharyngeal - Regular WFL  Penetration/Aspiration details (regular) (None)  Pharyngeal - Multi-consistency (None)  Penetration/Aspiration details (multi-consistency) (None)  Pharyngeal - Pill (None)  Penetration/Aspiration details (pill)  (None)  Pharyngeal Comment (None)     CHL IP CERVICAL ESOPHAGEAL PHASE 07/03/2014  Cervical Esophageal Phase WFL  Pudding Teaspoon (None)  Pudding Cup (None)  Honey Teaspoon (None)  Honey Cup (None)  Honey Syringe (None)  Nectar Teaspoon (None)  Nectar Cup (None)  Nectar Straw (None)  Nectar Syringe (None)  Thin Teaspoon (None)  Thin Cup (None)  Thin Straw (None)  Thin Syringe (None)  Cervical Esophageal Comment (None)    No flowsheet data found.         Houston Siren  07/03/2014, 1:44 PM   Orbie Pyo Colvin Caroli.Ed CCC-SLP Pager [phone]       Scheduled Meds: . cefTRIAXone (ROCEPHIN) IVPB 2 gram/50 mL D5W (Pyxis)  2 g Intravenous Q24H  . heparin  5,000 Units Subcutaneous 3 times per day   Continuous Infusions:    Time spent: > 35 minutes    Velvet Bathe  [facility] Pager [phone]. If 7PM-7AM, please contact night-coverage at [email or web address], password [facility] 07/04/2014, 5:21 PM  LOS: 1 day

## 2014-07-04 NOTE — Progress Notes (Signed)
Speech Language Pathology Treatment: Dysphagia  Patient Details Name: Anne Armstrong MRN: [id number] DOB: 07-19-1923 Today's Date: 07/04/2014 Time: 4315-4008 SLP Time Calculation (min) (ACUTE ONLY): 16 min  Assessment / Plan / Recommendation Clinical Impression  SLP provided skilled observation during lunch meal with regular consistency diet and thin liquids, providing Min cues for intermittent oral holding. Otherwise, pt consumed meal without overt signs of difficulty or aspiration. Pt independently recalled that she is to avoid straws, although did ask SLP for clarification. Recommend to continue regular diet and thin liquids, no further acute SLP needs identified.    HPI HPI: 81 y.o. female with history of bacterial endocarditis, GERD, pna, CVA 05/26/14, dysphagia, HTN admitted with episode of possible aspiration event when son was brushing her teeth, and may have had additional aspiration due to lethargy caused by the phenergan.She developed chest pain and was brought to the ED. Daughter reports pt on thick it in January and "graduated" to thin liquids, coughs intermittently. CXR atelectasis in the lung bases with possible focal consolidation and small effusion in the left base. Left basilar pneumonia not excluded.  BSE recommened MBS.   Pertinent Vitals Pain Assessment: No/denies pain  SLP Plan  All goals met    Recommendations Diet recommendations: Regular;Thin liquid Liquids provided via: Cup;No straw Medication Administration: Whole meds with liquid Supervision: Patient able to self feed;Intermittent supervision to cue for compensatory strategies Compensations: Small sips/bites;Slow rate Postural Changes and/or Swallow Maneuvers: Seated upright 90 degrees       Oral Care Recommendations: Oral care BID Follow up Recommendations: None Plan: All goals met    Germain Osgood, M.A. CCC-SLP [phone]  Germain Osgood 07/04/2014, 2:28 PM

## 2014-07-05 DIAGNOSIS — R7989 Other specified abnormal findings of blood chemistry: Secondary | ICD-10-CM

## 2014-07-05 DIAGNOSIS — I10 Essential (primary) hypertension: Secondary | ICD-10-CM

## 2014-07-05 LAB — TROPONIN I
TROPONIN I: 0.09 ng/mL — AB (ref <0.031)
TROPONIN I: 0.09 ng/mL — AB (ref <0.031)

## 2014-07-05 MED ORDER — PANTOPRAZOLE SODIUM 40 MG PO TBEC
40.0000 mg | DELAYED_RELEASE_TABLET | Freq: Every day | ORAL | Status: DC
  Administered 2014-07-05: 40 mg via ORAL
  Filled 2014-07-05: qty 1

## 2014-07-05 MED ORDER — HEPARIN SOD (PORK) LOCK FLUSH 100 UNIT/ML IV SOLN
250.0000 [IU] | INTRAVENOUS | Status: DC | PRN
  Administered 2014-07-05: 12:00:00

## 2014-07-05 MED ORDER — ASPIRIN 325 MG PO TABS
325.0000 mg | ORAL_TABLET | Freq: Every day | ORAL | Status: DC
  Administered 2014-07-05: 325 mg via ORAL
  Filled 2014-07-05: qty 1

## 2014-07-05 MED ORDER — METOPROLOL TARTRATE 25 MG PO TABS
25.0000 mg | ORAL_TABLET | Freq: Two times a day (BID) | ORAL | Status: DC
  Administered 2014-07-05: 25 mg via ORAL
  Filled 2014-07-05: qty 1

## 2014-07-05 MED ORDER — NITROGLYCERIN 0.4 MG SL SUBL
SUBLINGUAL_TABLET | SUBLINGUAL | Status: AC
  Filled 2014-07-05: qty 1

## 2014-07-05 MED ORDER — GABAPENTIN 300 MG PO CAPS
300.0000 mg | ORAL_CAPSULE | Freq: Three times a day (TID) | ORAL | Status: DC
  Administered 2014-07-05: 300 mg via ORAL
  Filled 2014-07-05: qty 1

## 2014-07-05 MED ORDER — MORPHINE SULFATE 2 MG/ML IJ SOLN
INTRAMUSCULAR | Status: AC
  Filled 2014-07-05: qty 1

## 2014-07-05 MED ORDER — OMEPRAZOLE 40 MG PO CPDR: 40.0000 mg | DELAYED_RELEASE_CAPSULE | Freq: Every day | ORAL | Status: AC

## 2014-07-05 MED ORDER — ATORVASTATIN CALCIUM 10 MG PO TABS: 20.0000 mg | ORAL_TABLET | Freq: Every evening | ORAL | Status: DC

## 2014-07-05 MED ORDER — DEXTROSE 5 % IV SOLN: 2.0000 g | INTRAVENOUS | Status: DC

## 2014-07-05 MED ORDER — MORPHINE SULFATE 2 MG/ML IJ SOLN
2.0000 mg | Freq: Once | INTRAMUSCULAR | Status: AC
  Administered 2014-07-05: 2 mg via INTRAVENOUS

## 2014-07-05 MED ORDER — TRAMADOL HCL 50 MG PO TABS: 50.0000 mg | ORAL_TABLET | Freq: Four times a day (QID) | ORAL | Status: DC | PRN

## 2014-07-05 MED ORDER — METOPROLOL TARTRATE 25 MG PO TABS: 25.0000 mg | ORAL_TABLET | Freq: Two times a day (BID) | ORAL | Status: DC

## 2014-07-05 MED ORDER — HYDRALAZINE HCL 25 MG PO TABS
25.0000 mg | ORAL_TABLET | Freq: Three times a day (TID) | ORAL | Status: DC
  Administered 2014-07-05: 25 mg via ORAL
  Filled 2014-07-05: qty 1

## 2014-07-05 NOTE — Progress Notes (Signed)
Patient ID: Anne Armstrong, female   DOB: 05/12/1923, 81 y.o.   MRN: [id number] See note by Dr Nelva Bush to d/c Troponin .09 no evolution  Jenkins Rouge

## 2014-07-05 NOTE — Discharge Summary (Signed)
PATIENT DETAILS Name: Anne Armstrong Age: 81 y.o. Sex: female Date of Birth: [birth year] MRN: [id number]. Admitting Physician: Etta Quill, DO OMV:[facility], Jeralyn Ruths, MD  Admit Date: 07/02/2014 Discharge date: 07/05/2014  Recommendations for Outpatient Follow-up:  1. On Rocephin for endocarditis-please continue at direction of ID at [facility] (Dr Garnette Scheuermann) 2. Repeat CBC/BMET in 1 week.  PRIMARY DISCHARGE DIAGNOSIS:  Principal Problem:   Aspiration into airway Active Problems:   Endocarditis, bacterial, acute/subacute   HCAP (healthcare-associated pneumonia)   Shortness of breath   Elevated troponin      PAST MEDICAL HISTORY: Past Medical History  Diagnosis Date  . Gastro-esophageal reflux   . Arthralgia   . A-fib   . Osteoarthritis   . Pneumonia   . UTI (urinary tract infection)   . NSTEMI (non-ST elevated myocardial infarction) 05/26/14  . Heart murmur   . Stroke 05/26/14    Septic emboli to cerebral and cerebellar hemispheres  . SVT (supraventricular tachycardia)   . 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 CAD (coronary artery disease) 05/26/14  . Dysphagia 05/26/14  . Hypertension   . History of fall   . Basal cell carcinoma of face   . Debility     DISCHARGE MEDICATIONS: Current Discharge Medication List    CONTINUE these medications which have CHANGED   Details  cefTRIAXone 2 g in dextrose 5 % 50 mL Inject 2 g into the vein daily. For 4 weeks-Please finish course as outlined by her Physicians in [facility]    omeprazole (PRILOSEC) 40 MG capsule Take 1 capsule (40 mg total) by mouth daily before breakfast.    traMADol (ULTRAM) 50 MG tablet Take 1 tablet (50 mg total) by mouth 4 (four) times daily as needed for moderate pain. Qty: 20 tablet, Refills: 0      CONTINUE these medications which have NOT CHANGED   Details  aspirin 325 MG tablet Take 325 mg by mouth daily.    atorvastatin  (LIPITOR) 20 MG tablet Take 20 mg by mouth every evening.    gabapentin (NEURONTIN) 300 MG capsule Take 300 mg by mouth 3 (three) times daily.     hydrALAZINE (APRESOLINE) 25 MG tablet Take 25 mg by mouth 3 (three) times daily.    !! meclizine (ANTIVERT) 12.5 MG tablet Take 1 tablet (12.5 mg total) by mouth 3 (three) times daily as needed for dizziness or nausea. Qty: 15 tablet, Refills: 0    metoprolol tartrate (LOPRESSOR) 25 MG tablet Take 25 mg by mouth 2 (two) times daily.     PROAIR HFA 108 (90 BASE) MCG/ACT inhaler Inhale 1 puff into the lungs every 6 (six) hours as needed for wheezing or shortness of breath.     !! meclizine (ANTIVERT) 25 MG tablet Take 25 mg by mouth 3 (three) times daily as needed for dizziness or nausea.      !! - Potential duplicate medications found. Please discuss with provider.      ALLERGIES:   Allergies  Allergen Reactions  . Iodine     Unknown reaction per MAR   . Macrobid [Nitrofurantoin]     Unknown reaction per MAR   . Novocain [Procaine]     Unknown reaction per MAR   . Penicillins     Unknown reaction per MAR   . Shellfish Allergy     Unknown reaction per MAR   . Sulfa Antibiotics     Unknown reaction per  MAR     BRIEF HPI:  See H&P, Labs, Consult and Test reports for all details in brief, patient  is a 81 y.o. female with history of bacterial endocarditis treated at [facility] in January. Discharged on rocephin, presented to the ED with SOB and possible Aspiration PNA.   CONSULTATIONS:   cardiology  PERTINENT RADIOLOGIC STUDIES: Ct Head Wo Contrast  07/03/2014   CLINICAL DATA:  81 year old female with nausea and vomiting. History of stroke.  EXAM: CT HEAD WITHOUT CONTRAST  TECHNIQUE: Contiguous axial images were obtained from the base of the skull through the vertex without intravenous contrast.  COMPARISON:  Head CT 05/23/2014, brain MRI 05/24/2014.  FINDINGS: The previous multifocal small embolic infarcts on MRI  are not well seen by CT. There is no intracranial hemorrhage. No subdural or extra-axial fluid collection. Generalized atrophy and chronic small vessel ischemic change, similar to prior exam. No midline shift. Bony calvarium is intact. Improved paranasal sinus disease compared to prior. Mild residual mucosal thickening is seen. Mastoid air cells are well aerated.  IMPRESSION: 1. Stable atrophy and chronic small vessel ischemia. 2. The previous multiple small infarcts on prior MRI are not well seen by CT.   Electronically Signed   By: Jeb Levering M.D.   On: 07/03/2014 00:22   Dg Chest Port 1 View  07/02/2014   CLINICAL DATA:  Shortness of breath, chest pain. History of hypertension, atrial fibrillation, pneumonia, stroke.  EXAM: PORTABLE CHEST - 1 VIEW  COMPARISON:  05/29/2014  FINDINGS: Cardiac enlargement without significant vascular congestion or edema. Atelectasis in the lung bases. Can't exclude consolidation and small effusion on the left base. No pneumothorax. Right PICC line tip over the cavoatrial junction. Calcification of aorta. Post kyphoplasty changes in the thoracolumbar region. Degenerative changes in the spine and shoulders.  IMPRESSION: Cardiac enlargement. Atelectasis in the lung bases with possible focal consolidation and small effusion in the left base. Left basilar pneumonia not excluded.   Electronically Signed   By: Lucienne Capers M.D.   On: 07/02/2014 23:11   Dg Swallowing Func-speech Pathology  07/03/2014    Objective Swallowing Evaluation:    Patient Details  Name: Anne Armstrong MRN: [id number] Date of Birth: December 06, 1923  Today's Date: 07/03/2014 Time: SLP Start Time (ACUTE ONLY): 1110-SLP Stop Time (ACUTE ONLY): 1131 SLP Time Calculation (min) (ACUTE ONLY): 21 min  Past Medical History:  Past Medical History  Diagnosis Date  . Gastro-esophageal reflux   . Arthralgia   . A-fib   . Osteoarthritis   . Pneumonia   . UTI (urinary tract infection)   . NSTEMI (non-ST elevated myocardial  infarction) 05/26/14  . Heart murmur   . Stroke 05/26/14    Septic emboli to cerebral and cerebellar hemispheres  . SVT (supraventricular tachycardia)   . 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 CAD (coronary artery disease) 05/26/14  . Dysphagia 05/26/14  . Hypertension   . History of fall   . Basal cell carcinoma of face   . Debility    Past Surgical History: No past surgical history on file. HPI:  HPI: 81 y.o. female with history of bacterial endocarditis, GERD, pna, CVA  05/26/14, dysphagia, HTN admitted with episode of possible aspiration event  when son was brushing her teeth, and may have had additional aspiration  due to lethargy caused by the phenergan.She developed chest pain and was  brought to the ED. Daughter reports pt on thick  it in January and  "graduated" to thin liquids, coughs intermittently. CXR atelectasis in the  lung bases with possible focal consolidation and small effusion in the  left base. Left basilar pneumonia not excluded.  BSE recommened MBS.  No Data Recorded  Assessment / Plan / Recommendation CHL IP CLINICAL IMPRESSIONS 07/03/2014  Dysphagia Diagnosis Mild pharyngeal phase dysphagia  Clinical impression Pt exhibited mild pharyngeal dysphagia primarily  isolated to use of straws with thin liquids resulting in sensed  aspiration. Cups sips thin were swallowed safely. Slight hesitation at  posterior tongue, however initiation of swallow was within functional  limits. Oral prep, mastication and transit functional with regular  texture. Recommend regular texture/thin liquids, no straws, pills with  liquid, small sips, sit upright. Brief follow up once to ensure safety and  efficiency.      CHL IP TREATMENT RECOMMENDATION 07/03/2014  Treatment Plan Recommendations Therapy as outlined in treatment plan below      CHL IP DIET RECOMMENDATION 07/03/2014  Diet Recommendations Regular;Thin liquid  Liquid Administration via Cup;No straw  Medication  Administration Whole meds with liquid  Compensations Small sips/bites;Slow rate  Postural Changes and/or Swallow Maneuvers Seated upright 90 degrees     CHL IP OTHER RECOMMENDATIONS 07/03/2014  Recommended Consults (None)  Oral Care Recommendations Oral care BID  Other Recommendations (None)     CHL IP FOLLOW UP RECOMMENDATIONS 07/03/2014  Follow up Recommendations None     CHL IP FREQUENCY AND DURATION 07/03/2014  Speech Therapy Frequency (ACUTE ONLY) min 1 x/week  Treatment Duration 1 week     Pertinent Vitals/Pain none    SLP Swallow Goals No flowsheet data found.  No flowsheet data found.    CHL IP REASON FOR REFERRAL 07/03/2014  Reason for Referral Objectively evaluate swallowing function     CHL IP ORAL PHASE 07/03/2014  Lips (None)  Tongue (None)  Mucous membranes (None)  Nutritional status (None)  Other (None)  Oxygen therapy (None)  Oral Phase WFL  Oral - Pudding Teaspoon (None)  Oral - Pudding Cup (None)  Oral - Honey Teaspoon (None)  Oral - Honey Cup (None)  Oral - Honey Syringe (None)  Oral - Nectar Teaspoon (None)  Oral - Nectar Cup (None)  Oral - Nectar Straw (None)  Oral - Nectar Syringe (None)  Oral - Ice Chips (None)  Oral - Thin Teaspoon (None)  Oral - Thin Cup (None)  Oral - Thin Straw (None)  Oral - Thin Syringe (None)  Oral - Puree (None)  Oral - Mechanical Soft (None)  Oral - Regular (None)  Oral - Multi-consistency (None)  Oral - Pill (None)  Oral Phase - Comment (None)      CHL IP PHARYNGEAL PHASE 07/03/2014  Pharyngeal Phase Impaired  Pharyngeal - Pudding Teaspoon (None)  Penetration/Aspiration details (pudding teaspoon) (None)  Pharyngeal - Pudding Cup (None)  Penetration/Aspiration details (pudding cup) (None)  Pharyngeal - Honey Teaspoon (None)  Penetration/Aspiration details (honey teaspoon) (None)  Pharyngeal - Honey Cup (None)  Penetration/Aspiration details (honey cup) (None)  Pharyngeal - Honey Syringe (None)  Penetration/Aspiration details (honey syringe) (None)  Pharyngeal - Nectar Teaspoon  WFL  Penetration/Aspiration details (nectar teaspoon) (None)  Pharyngeal - Nectar Cup [facility]  Penetration/Aspiration details (nectar cup) (None)  Pharyngeal - Nectar Straw (None)  Penetration/Aspiration details (nectar straw) (None)  Pharyngeal - Nectar Syringe (None)  Penetration/Aspiration details (nectar syringe) (None)  Pharyngeal - Ice Chips (None)  Penetration/Aspiration details (ice chips) (None)  Pharyngeal - Thin Teaspoon WFL  Penetration/Aspiration details (  thin teaspoon) (None)  Pharyngeal - Thin Cup [facility]  Penetration/Aspiration details (thin cup) (None)  Pharyngeal - Thin Straw Penetration/Aspiration during swallow;Reduced  airway/laryngeal closure  Penetration/Aspiration details (thin straw) Material enters airway, passes  BELOW cords and not ejected out despite cough attempt by patient  Pharyngeal - Thin Syringe (None)  Penetration/Aspiration details (thin syringe') (None)  Pharyngeal - Puree (None)  Penetration/Aspiration details (puree) (None)  Pharyngeal - Mechanical Soft (None)  Penetration/Aspiration details (mechanical soft) (None)  Pharyngeal - Regular WFL  Penetration/Aspiration details (regular) (None)  Pharyngeal - Multi-consistency (None)  Penetration/Aspiration details (multi-consistency) (None)  Pharyngeal - Pill (None)  Penetration/Aspiration details (pill) (None)  Pharyngeal Comment (None)     CHL IP CERVICAL ESOPHAGEAL PHASE 07/03/2014  Cervical Esophageal Phase WFL  Pudding Teaspoon (None)  Pudding Cup (None)  Honey Teaspoon (None)  Honey Cup (None)  Honey Syringe (None)  Nectar Teaspoon (None)  Nectar Cup (None)  Nectar Straw (None)  Nectar Syringe (None)  Thin Teaspoon (None)  Thin Cup (None)  Thin Straw (None)  Thin Syringe (None)  Cervical Esophageal Comment (None)    No flowsheet data found.         Houston Siren  07/03/2014, 1:44 PM   Orbie Pyo Colvin Caroli.Ed CCC-SLP Pager [phone]        PERTINENT LAB RESULTS: CBC:  Recent Labs  07/02/14 2324 07/04/14 0445  WBC 12.5*  8.0  HGB 11.2* 9.0*  HCT 34.0* 27.5*  PLT 292 209   CMET CMP     Component Value Date/Time   NA 133* 07/02/2014 2324   K 4.3 07/02/2014 2324   CL 96 07/02/2014 2324   CO2 28 07/02/2014 2324   GLUCOSE 136* 07/02/2014 2324   BUN 21 07/02/2014 2324   CREATININE 0.81 07/02/2014 2324   CALCIUM 9.1 07/02/2014 2324   PROT 6.8 07/02/2014 2324   ALBUMIN 3.3* 07/02/2014 2324   AST 59* 07/02/2014 2324   ALT 60* 07/02/2014 2324   ALKPHOS 127* 07/02/2014 2324   BILITOT 0.4 07/02/2014 2324   GFRNONAA 63* 07/02/2014 2324   GFRAA 72* 07/02/2014 2324    GFR CrCl cannot be calculated (Unknown ideal weight.).  Recent Labs  07/02/14 2324  LIPASE 61*    Recent Labs  07/04/14 1946 07/05/14 0029 07/05/14 0523  TROPONINI 0.07* 0.09* 0.09*   Invalid input(s): POCBNP No results for input(s): DDIMER in the last 72 hours. No results for input(s): HGBA1C in the last 72 hours. No results for input(s): CHOL, HDL, LDLCALC, TRIG, CHOLHDL, LDLDIRECT in the last 72 hours. No results for input(s): TSH, T4TOTAL, T3FREE, THYROIDAB in the last 72 hours.  Invalid input(s): FREET3 No results for input(s): VITAMINB12, FOLATE, FERRITIN, TIBC, IRON, RETICCTPCT in the last 72 hours. Coags: No results for input(s): INR in the last 72 hours.  Invalid input(s): PT Microbiology: Recent Results (from the past 240 hour(s))  Urine culture     Status: None   Collection Time: 07/02/14 10:42 PM  Result Value Ref Range Status   Specimen Description URINE, CATHETERIZED  Final   Special Requests NONE  Final   Colony Count   Final    50,000 COLONIES/ML Performed at [facility]    Culture YEAST Performed at [facility]   Final   Report Status 07/04/2014 FINAL  Final  Culture, blood (routine x 2)     Status: None (Preliminary result)   Collection Time: 07/02/14 11:09 PM  Result Value Ref Range Status   Specimen Description BLOOD  LEFT FOREARM  Final   Special Requests BOTTLES DRAWN  AEROBIC AND ANAEROBIC 5CC EA  Final   Culture   Final           BLOOD CULTURE RECEIVED NO GROWTH TO DATE CULTURE WILL BE HELD FOR 5 DAYS BEFORE ISSUING A FINAL NEGATIVE REPORT Performed at [facility]    Report Status PENDING  Incomplete  Culture, blood (routine x 2)     Status: None (Preliminary result)   Collection Time: 07/02/14 11:22 PM  Result Value Ref Range Status   Specimen Description BLOOD LEFT HAND  Final   Special Requests BOTTLES DRAWN AEROBIC ONLY 5CC  Final   Culture   Final           BLOOD CULTURE RECEIVED NO GROWTH TO DATE CULTURE WILL BE HELD FOR 5 DAYS BEFORE ISSUING A FINAL NEGATIVE REPORT Performed at [facility]    Report Status PENDING  Incomplete     BRIEF HOSPITAL COURSE:   Principal Problem: ?Asp DGL:OVFIEPPI and continued on IV Rocephin. Doing well, with no fever. Seen by SLP, recommendations are to continue regular diet. On day of discharge, awake and alert. Spoke with daughter over, informed her that patient stable for discharge today. Wound continue with Rocephin, till end date as directed by ID at [place].   Active Problems:   History of Endocarditis, bacterial, acute/subacute:diagnosed at [facility]. Was on Rocephin. This is being continued, end date unknown, spoke with SNF MD-Dr Bubba Camp over the phone, who will coordinate with ID at [facility] Dr Garnette Scheuermann.    Elevated Troponin: seen by cards, no further work up recommended. Continue to manage medically    GERD:c/w PPI   HTN: resume metoprolol and hydralazine on discharge.    TODAY-DAY OF DISCHARGE:  Subjective:   Anne Armstrong today has no headache,no chest abdominal pain,no new weakness tingling or numbness, feels much better wants to go home today.   Objective:   Blood pressure 177/88, pulse 101, temperature 97.9 F (36.6 C), temperature source Oral, resp. rate 18, SpO2 96 %.  Intake/Output Summary (Last 24 hours) at 07/05/14 0859 Last data filed at  07/04/14 1500  Gross per 24 hour  Intake    100 ml  Output      0 ml  Net    100 ml   There were no vitals filed for this visit.  Exam Awake Alert, Oriented *3, No new F.N deficits, Normal affect [place].AT,PERRAL Supple Neck,No JVD, No cervical lymphadenopathy appriciated.  Symmetrical Chest wall movement, Good air movement bilaterally, CTAB RRR,No Gallops,Rubs or new Murmurs, No Parasternal Heave +ve B.Sounds, Abd Soft, Non tender, No organomegaly appriciated, No rebound -guarding or rigidity. No Cyanosis, Clubbing or edema, No new Rash or bruise  DISCHARGE CONDITION: Stable  DISPOSITION: SNF  DISCHARGE INSTRUCTIONS:    Activity:  As tolerated with Full fall precautions use walker/cane & assistance as needed  Diet recommendation: Heart Healthy diet  Discharge Instructions    Call MD for:  temperature >100.4    Complete by:  As directed      Diet - low sodium heart healthy    Complete by:  As directed      Increase activity slowly    Complete by:  As directed            Follow-up Information    Follow up with Imagene Riches, MD. Schedule an appointment as soon as possible for a visit in 1 week.  Specialty:  Internal Medicine   Why:  after discharge from SNF   Contact information:   [address] [place] [address] [phone]       Follow up with [facility], MD. Schedule an appointment as soon as possible for a visit in 1 week.   Specialty:  Infectious Diseases       Total Time spent on discharge equals 45 minutes.  SignedOren Binet 07/05/2014 8:59 AM

## 2014-07-05 NOTE — Clinical Social Work Placement (Signed)
Clinical Social Work Department CLINICAL SOCIAL WORK PLACEMENT NOTE 07/05/2014  Patient:  Anne Armstrong, Anne Armstrong  Account Number:  [id number] [place] date:  07/02/2014  Clinical Social Worker:  Wylene Men  Date/time:  07/05/2014 10:48 AM  Clinical Social Work is seeking post-discharge placement for this patient at the following level of care:   SKILLED NURSING   (*CSW will update this form in Epic as items are completed)   07/05/2014  Patient/family provided with [place] Department of Clinical Social Work's list of facilities offering this level of care within the geographic area requested by the patient (or if unable, by the patient's family).  07/05/2014  Patient/family informed of their freedom to choose among providers that offer the needed level of care, that participate in Medicare, Medicaid or managed care program needed by the patient, have an available bed and are willing to accept the patient.  07/05/2014  Patient/family informed of [facility]' ownership interest in [facility], as well as of the fact that they are under no obligation to receive care at this facility.  PASARR submitted to EDS on  PASARR number received on   FL2 transmitted to all facilities in geographic area requested by pt/family on  07/05/2014 FL2 transmitted to all facilities within larger geographic area on   Patient informed that his/her managed care company has contracts with or will negotiate with  certain facilities, including the following:     Patient/family informed of bed offers received:   Patient chooses bed at [facility] Physician recommends and patient chooses bed at    Patient to be transferred to [place] on  07/05/2014 Patient to be transferred to facility by [facility] Patient and family notified of transfer on 07/05/2014 Name of family member notified:  Juliann Pulse  The following physician request were entered in Epic:   Additional Comments: PASARR is existing and  Patient is from [facility]   Nonnie Done, [place] [phone]  Psychiatric & Orthopedics (5N 1-16) Clinical Social Worker

## 2014-07-05 NOTE — Progress Notes (Signed)
Pt found in room early this morning making grunting sounds and complaining of chest pain. Vital signs were taken. BP and pulse was elevated. Charge nurse was notified and rapid response was called to the bedside. MD was notified of event. Pt was given nitro and morphine for pain. Stat ekg showed little changes. Troponins are scheduled for 5 am. RN will continue to monitor patient. She is sleeping in room now. Vital signs are stable at this time.

## 2014-07-05 NOTE — Clinical Social Work Psychosocial (Signed)
Clinical Social Work Department BRIEF PSYCHOSOCIAL ASSESSMENT 07/05/2014  Patient:  Anne Armstrong, Anne Armstrong     Account Number:  [id number]     Admit date:  07/02/2014  Clinical Social Worker:  Wylene Men  Date/Time:  07/05/2014 10:44 AM  Referred by:  Physician  Date Referred:  07/05/2014 Referred for  SNF Placement   Other Referral:   from [place] type:  Other - See comment Other interview type:   patient and patient's daughter, Juliann Pulse    PSYCHOSOCIAL DATA Living Status:  FACILITY Admitted from facility:  [place] Level of care:  [place] Primary support name:  Juliann Pulse Primary support relationship to patient:  CHILD, ADULT Degree of support available:   strong    CURRENT CONCERNS Current Concerns  Post-Acute Placement   Other Concerns:   none    SOCIAL WORK ASSESSMENT / PLAN CSW received report that patient is from [facility]. Patient is oriented only to person at this time which daughter reports is baseline.  Patient has hx dx of dementia without behaviors.  Patient's daughter reports wishes of having patient return to [place] at time of dc. CSW confirmed with [facility] that patient is able to return once medically discharged.  CSW offered caregiver support to daughter.  Daughter requested patient be transferred back to [place] via [place].  CSW to arrange.   Assessment/plan status:  Psychosocial Support/Ongoing Assessment of Needs Other assessment/ plan:   FL2  PASARR-existing   Information/referral to community resources:   SNF/STR    PATIENT'S/FAMILY'S RESPONSE TO PLAN OF CARE: Patient will return to [facility].       Nonnie Done, [place] [phone]  Psychiatric & Orthopedics (5N 1-16) Clinical Social Worker

## 2014-07-05 NOTE — Discharge Planning (Signed)
Patient will discharge today per MD order. Patient will discharge to [facility] RN to call report prior to transportation to: [phone] Transportation: [facility]  CSW sent discharge summary to SNF for review.  Packet is complete.  RN, patient and family aware of discharge plans.  Nonnie Done, [place] [phone]  Psychiatric & Orthopedics (5N 1-16) Clinical Social Worker

## 2014-07-05 NOTE — Progress Notes (Signed)
Patients VS stable. PICC line hep locked by IV team. Report called to RN at [facility]. Patient transferred via [facility] to [facility].

## 2014-07-05 NOTE — Significant Event (Signed)
Rapid Response Event Note Called per floor RN for pt with new chest pain in middle of chest.  BP 184/86 HR 105.  Advised RN to get EKG and give NTG x1 and notify provider while RRT en route  Overview: Time Called: 0300 Arrival Time: 0315 Event Type: Cardiac  Initial Focused Assessment: Pt found resting in bed. Apears uncomfortable. Awake, follows commnands. Complains of chest pain "a little better than before" at first unable to rate her pain. NTG x 1 given just prior to my arrival. Eventually pt able to give a score of 8/10 pain. Po2 93-94 no respiratory distress.  Interventions: EKG compared to prior EKG done 3/7, no changes seen. K.Scorr NP called back and updated on pt status. Pt originally admitted for aspiration PNA, resolving endocarditis,  and chest pain. Troponin's trending up with next troponin due this AM. Cardiology following.   2 mg  Morphine ordered and given. Per pt she is feeling "A little better" after morphine IVP. RN to monitor pain and notify provider if not resolving soon. RN to monitor AM troponin and update provider if greatly increased.   Event Summary: Name of Physician Notified: K Schorr NP [facility] at      at    Outcome: Stayed in room and stabalized     [name], [name]

## 2014-07-06 ENCOUNTER — Encounter: Payer: Self-pay | Admitting: Adult Health

## 2014-07-06 ENCOUNTER — Non-Acute Institutional Stay (SKILLED_NURSING_FACILITY): Payer: Medicare Other | Admitting: Adult Health

## 2014-07-06 ENCOUNTER — Other Ambulatory Visit: Payer: Self-pay | Admitting: *Deleted

## 2014-07-06 DIAGNOSIS — J189 Pneumonia, unspecified organism: Secondary | ICD-10-CM

## 2014-07-06 DIAGNOSIS — M545 Low back pain, unspecified: Secondary | ICD-10-CM

## 2014-07-06 DIAGNOSIS — I10 Essential (primary) hypertension: Secondary | ICD-10-CM | POA: Diagnosis not present

## 2014-07-06 DIAGNOSIS — I482 Chronic atrial fibrillation, unspecified: Secondary | ICD-10-CM

## 2014-07-06 DIAGNOSIS — K219 Gastro-esophageal reflux disease without esophagitis: Secondary | ICD-10-CM

## 2014-07-06 DIAGNOSIS — I288 Other diseases of pulmonary vessels: Secondary | ICD-10-CM

## 2014-07-06 DIAGNOSIS — G629 Polyneuropathy, unspecified: Secondary | ICD-10-CM | POA: Diagnosis not present

## 2014-07-06 DIAGNOSIS — E785 Hyperlipidemia, unspecified: Secondary | ICD-10-CM | POA: Diagnosis not present

## 2014-07-06 DIAGNOSIS — R5381 Other malaise: Secondary | ICD-10-CM

## 2014-07-06 DIAGNOSIS — I330 Acute and subacute infective endocarditis: Secondary | ICD-10-CM

## 2014-07-06 DIAGNOSIS — C44310 Basal cell carcinoma of skin of unspecified parts of face: Secondary | ICD-10-CM

## 2014-07-06 MED ORDER — TRAMADOL HCL 50 MG PO TABS: ORAL_TABLET | ORAL | Status: DC

## 2014-07-06 NOTE — Progress Notes (Signed)
Patient ID: Anne Armstrong, female   DOB: 09/06/1923, 81 y.o.   MRN: [id number]   07/06/2014  Facility:  Nursing Home Location:  [place] Room Number: 407-P LEVEL OF CARE:  SNF (31)   Chief Complaint  Patient presents with  . Hospitalization Follow-up    Debility, pneumonia, bacterial endocarditis, GERD, hyperlipidemia, neuropathy, hypertension, basal cell carcinoma, chronic atrial fibrillation and low back pain    HISTORY OF PRESENT ILLNESS:  This is an 81 year old female who was been re-admitted to [facility] on 07/05/14 from [facility]. She has a history of bacterial endocarditis treated at [facility] in January. She was discharged on Rocephin IV while doing rehabilitation at [facility]. She was having nausea and was given Phenergan suppository. She then complained of belly and chest pain. She was then transferred to the hospital and was diagnosed with HCAP.   She was seen today in her room and her son @ bedside. She complained of low back pain.  She has been re-admitted for a short-term rehabilitation.  PAST MEDICAL HISTORY:  Past Medical History  Diagnosis Date  . Gastro-esophageal reflux   . Arthralgia   . A-fib   . Osteoarthritis   . Pneumonia   . UTI (urinary tract infection)   . NSTEMI (non-ST elevated myocardial infarction) 05/26/14  . Heart murmur   . Stroke 05/26/14    Septic emboli to cerebral and cerebellar hemispheres  . SVT (supraventricular tachycardia)   . 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 CAD (coronary artery disease) 05/26/14  . Dysphagia 05/26/14  . Hypertension   . History of fall   . Basal cell carcinoma of face   . Debility     CURRENT MEDICATIONS: Reviewed per MAR/see medication list  Allergies  Allergen Reactions  . Iodine     Unknown reaction per MAR   . Macrobid [Nitrofurantoin]     Unknown reaction per MAR   . Novocain  [Procaine]     Unknown reaction per MAR   . Penicillins     Unknown reaction per MAR   . Shellfish Allergy     Unknown reaction per MAR   . Sulfa Antibiotics     Unknown reaction per [facility]      REVIEW OF SYSTEMS:  GENERAL: no change in appetite, no fever, chills  RESPIRATORY: no cough, SOB, DOE, wheezing, hemoptysis CARDIAC: no chest pain, or palpitations GI: no abdominal pain, diarrhea, constipation, heart burn, nausea or vomiting  PHYSICAL EXAMINATION  GENERAL: no acute distress, normal body habitus EYES: conjunctivae normal, sclerae normal, normal eye lids NECK: supple, trachea midline, no neck masses, no thyroid tenderness, no thyromegaly LYMPHATICS: no LAN in the neck, no supraclavicular LAN RESPIRATORY: breathing is even & unlabored, BS CTAB CARDIAC: RRR, no murmur,no extra heart sounds, BLE edema trace GI: abdomen soft, normal BS, no masses, no tenderness, no hepatomegaly, no splenomegaly EXTREMITIES: BLE weakness, able to move BUE; right upper arm PICC PSYCHIATRIC: the patient is alert & oriented to person, affect & behavior appropriate  LABS/RADIOLOGY: Labs reviewed: Basic Metabolic Panel:  Recent Labs  07/02/14 2324  NA 133*  K 4.3  CL 96  CO2 28  GLUCOSE 136*  BUN 21  CREATININE 0.81  CALCIUM 9.1   Liver Function Tests:  Recent Labs  07/02/14 2324  AST 59*  ALT 60*  ALKPHOS 127*  BILITOT 0.4  PROT 6.8  ALBUMIN 3.3*  Recent Labs  07/02/14 2324  LIPASE 61*    CBC:  Recent Labs  07/02/14 2324 07/04/14 0445  WBC 12.5* 8.0  NEUTROABS 10.5*  --   HGB 11.2* 9.0*  HCT 34.0* 27.5*  MCV 86.7 86.8  PLT 292 209   Cardiac Enzymes:  Recent Labs  07/04/14 1946 07/05/14 0029 07/05/14 0523  TROPONINI 0.07* 0.09* 0.09*     Ct Head Wo Contrast  07/03/2014   CLINICAL DATA:  81 year old female with nausea and vomiting. History of stroke.  EXAM: CT HEAD WITHOUT CONTRAST  TECHNIQUE: Contiguous axial images were obtained from the base of the  skull through the vertex without intravenous contrast.  COMPARISON:  Head CT 05/23/2014, brain MRI 05/24/2014.  FINDINGS: The previous multifocal small embolic infarcts on MRI are not well seen by CT. There is no intracranial hemorrhage. No subdural or extra-axial fluid collection. Generalized atrophy and chronic small vessel ischemic change, similar to prior exam. No midline shift. Bony calvarium is intact. Improved paranasal sinus disease compared to prior. Mild residual mucosal thickening is seen. Mastoid air cells are well aerated.  IMPRESSION: 1. Stable atrophy and chronic small vessel ischemia. 2. The previous multiple small infarcts on prior MRI are not well seen by CT.   Electronically Signed   By: Jeb Levering M.D.   On: 07/03/2014 00:22   Dg Chest Port 1 View  07/02/2014   CLINICAL DATA:  Shortness of breath, chest pain. History of hypertension, atrial fibrillation, pneumonia, stroke.  EXAM: PORTABLE CHEST - 1 VIEW  COMPARISON:  05/29/2014  FINDINGS: Cardiac enlargement without significant vascular congestion or edema. Atelectasis in the lung bases. Can't exclude consolidation and small effusion on the left base. No pneumothorax. Right PICC line tip over the cavoatrial junction. Calcification of aorta. Post kyphoplasty changes in the thoracolumbar region. Degenerative changes in the spine and shoulders.  IMPRESSION: Cardiac enlargement. Atelectasis in the lung bases with possible focal consolidation and small effusion in the left base. Left basilar pneumonia not excluded.   Electronically Signed   By: Lucienne Capers M.D.   On: 07/02/2014 23:11   Dg Swallowing Func-speech Pathology  07/03/2014    Objective Swallowing Evaluation:    Patient Details  Name: Anne Armstrong MRN: [id number] Date of Birth: May 28, 1923  Today's Date: 07/03/2014 Time: SLP Start Time (ACUTE ONLY): 1110-SLP Stop Time (ACUTE ONLY): 1131 SLP Time Calculation (min) (ACUTE ONLY): 21 min  Past Medical History:  Past Medical History   Diagnosis Date  . Gastro-esophageal reflux   . Arthralgia   . A-fib   . Osteoarthritis   . Pneumonia   . UTI (urinary tract infection)   . NSTEMI (non-ST elevated myocardial infarction) 05/26/14  . Heart murmur   . Stroke 05/26/14    Septic emboli to cerebral and cerebellar hemispheres  . SVT (supraventricular tachycardia)   . 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 CAD (coronary artery disease) 05/26/14  . Dysphagia 05/26/14  . Hypertension   . History of fall   . Basal cell carcinoma of face   . Debility    Past Surgical History: No past surgical history on file. HPI:  HPI: 81 y.o. female with history of bacterial endocarditis, GERD, pna, CVA  05/26/14, dysphagia, HTN admitted with episode of possible aspiration event  when son was brushing her teeth, and may have had additional aspiration  due to lethargy caused by the phenergan.She developed chest pain and was  brought to the  ED. Daughter reports pt on thick it in January and  "graduated" to thin liquids, coughs intermittently. CXR atelectasis in the  lung bases with possible focal consolidation and small effusion in the  left base. Left basilar pneumonia not excluded.  BSE recommened MBS.  No Data Recorded  Assessment / Plan / Recommendation CHL IP CLINICAL IMPRESSIONS 07/03/2014  Dysphagia Diagnosis Mild pharyngeal phase dysphagia  Clinical impression Pt exhibited mild pharyngeal dysphagia primarily  isolated to use of straws with thin liquids resulting in sensed  aspiration. Cups sips thin were swallowed safely. Slight hesitation at  posterior tongue, however initiation of swallow was within functional  limits. Oral prep, mastication and transit functional with regular  texture. Recommend regular texture/thin liquids, no straws, pills with  liquid, small sips, sit upright. Brief follow up once to ensure safety and  efficiency.      CHL IP TREATMENT RECOMMENDATION 07/03/2014  Treatment Plan Recommendations Therapy  as outlined in treatment plan below      CHL IP DIET RECOMMENDATION 07/03/2014  Diet Recommendations Regular;Thin liquid  Liquid Administration via Cup;No straw  Medication Administration Whole meds with liquid  Compensations Small sips/bites;Slow rate  Postural Changes and/or Swallow Maneuvers Seated upright 90 degrees     CHL IP OTHER RECOMMENDATIONS 07/03/2014  Recommended Consults (None)  Oral Care Recommendations Oral care BID  Other Recommendations (None)     CHL IP FOLLOW UP RECOMMENDATIONS 07/03/2014  Follow up Recommendations None     CHL IP FREQUENCY AND DURATION 07/03/2014  Speech Therapy Frequency (ACUTE ONLY) min 1 x/week  Treatment Duration 1 week     Pertinent Vitals/Pain none    SLP Swallow Goals No flowsheet data found.  No flowsheet data found.    CHL IP REASON FOR REFERRAL 07/03/2014  Reason for Referral Objectively evaluate swallowing function     CHL IP ORAL PHASE 07/03/2014  Lips (None)  Tongue (None)  Mucous membranes (None)  Nutritional status (None)  Other (None)  Oxygen therapy (None)  Oral Phase WFL  Oral - Pudding Teaspoon (None)  Oral - Pudding Cup (None)  Oral - Honey Teaspoon (None)  Oral - Honey Cup (None)  Oral - Honey Syringe (None)  Oral - Nectar Teaspoon (None)  Oral - Nectar Cup (None)  Oral - Nectar Straw (None)  Oral - Nectar Syringe (None)  Oral - Ice Chips (None)  Oral - Thin Teaspoon (None)  Oral - Thin Cup (None)  Oral - Thin Straw (None)  Oral - Thin Syringe (None)  Oral - Puree (None)  Oral - Mechanical Soft (None)  Oral - Regular (None)  Oral - Multi-consistency (None)  Oral - Pill (None)  Oral Phase - Comment (None)      CHL IP PHARYNGEAL PHASE 07/03/2014  Pharyngeal Phase Impaired  Pharyngeal - Pudding Teaspoon (None)  Penetration/Aspiration details (pudding teaspoon) (None)  Pharyngeal - Pudding Cup (None)  Penetration/Aspiration details (pudding cup) (None)  Pharyngeal - Honey Teaspoon (None)  Penetration/Aspiration details (honey teaspoon) (None)  Pharyngeal - Honey Cup (None)   Penetration/Aspiration details (honey cup) (None)  Pharyngeal - Honey Syringe (None)  Penetration/Aspiration details (honey syringe) (None)  Pharyngeal - Nectar Teaspoon WFL  Penetration/Aspiration details (nectar teaspoon) (None)  Pharyngeal - Nectar Cup [facility]  Penetration/Aspiration details (nectar cup) (None)  Pharyngeal - Nectar Straw (None)  Penetration/Aspiration details (nectar straw) (None)  Pharyngeal - Nectar Syringe (None)  Penetration/Aspiration details (nectar syringe) (None)  Pharyngeal - Ice Chips (None)  Penetration/Aspiration details (ice chips) (None)  Pharyngeal -  Thin Teaspoon WFL  Penetration/Aspiration details (thin teaspoon) (None)  Pharyngeal - Thin Cup [facility]  Penetration/Aspiration details (thin cup) (None)  Pharyngeal - Thin Straw Penetration/Aspiration during swallow;Reduced  airway/laryngeal closure  Penetration/Aspiration details (thin straw) Material enters airway, passes  BELOW cords and not ejected out despite cough attempt by patient  Pharyngeal - Thin Syringe (None)  Penetration/Aspiration details (thin syringe') (None)  Pharyngeal - Puree (None)  Penetration/Aspiration details (puree) (None)  Pharyngeal - Mechanical Soft (None)  Penetration/Aspiration details (mechanical soft) (None)  Pharyngeal - Regular WFL  Penetration/Aspiration details (regular) (None)  Pharyngeal - Multi-consistency (None)  Penetration/Aspiration details (multi-consistency) (None)  Pharyngeal - Pill (None)  Penetration/Aspiration details (pill) (None)  Pharyngeal Comment (None)     CHL IP CERVICAL ESOPHAGEAL PHASE 07/03/2014  Cervical Esophageal Phase WFL  Pudding Teaspoon (None)  Pudding Cup (None)  Honey Teaspoon (None)  Honey Cup (None)  Honey Syringe (None)  Nectar Teaspoon (None)  Nectar Cup (None)  Nectar Straw (None)  Nectar Syringe (None)  Thin Teaspoon (None)  Thin Cup (None)  Thin Straw (None)  Thin Syringe (None)  Cervical Esophageal Comment (None)    No flowsheet data found.         Houston Siren   07/03/2014, 1:44 PM   Orbie Pyo Colvin Caroli.Ed CCC-SLP Pager [phone]       ASSESSMENT/PLAN:  Debility - for rehabilitation HCAP (healthcare acquired pneumonia) - continue Rocephin 2 gm IV Q D; titrate O2 as tolerated Bacterial endocarditis - continue Rocephin 2 g IV daily; follow-up with infectious doctor at [facility] GERD - continue omeprazole 40 mg by mouth daily Hyperlipidemia - continue Lipitor 20 mg by mouth every evening Neuropathy - patient noted to be sleepy so Neurontin will be decreased to 200 mg by mouth 3 times a day Hypertension - continue hydralazine 25 mg by mouth 3 times a day and Lopressor 25 mg by mouth twice a day Basal cell carcinoma - area on forehead is healing  Chronic atrial fibrillation - rate controlled; has a history of fall; continue aspirin 325 mg by mouth daily and Lopressor 25 mg by mouth twice a day Low back pain - start tramadol 50 mg by mouth twice a day and continue tramadol 50 mg by mouth 4 times a day when necessary and Lidoderm patch 5% transdermally to lower back daily   Goals of care:  Short-term rehabilitation   Labs/test ordered:  For CBC and BMET on  07/12/14  Spent 50 minutes in patient care.    [facility], NP [employer] [phone]

## 2014-07-06 NOTE — Telephone Encounter (Signed)
[facility] 

## 2014-07-07 ENCOUNTER — Encounter: Payer: Self-pay | Admitting: *Deleted

## 2014-07-07 ENCOUNTER — Non-Acute Institutional Stay (SKILLED_NURSING_FACILITY): Payer: Medicare Other | Admitting: Internal Medicine

## 2014-07-07 DIAGNOSIS — E785 Hyperlipidemia, unspecified: Secondary | ICD-10-CM | POA: Diagnosis not present

## 2014-07-07 DIAGNOSIS — I330 Acute and subacute infective endocarditis: Secondary | ICD-10-CM

## 2014-07-07 DIAGNOSIS — J189 Pneumonia, unspecified organism: Secondary | ICD-10-CM

## 2014-07-07 DIAGNOSIS — R5381 Other malaise: Secondary | ICD-10-CM | POA: Diagnosis not present

## 2014-07-07 DIAGNOSIS — I10 Essential (primary) hypertension: Secondary | ICD-10-CM | POA: Diagnosis not present

## 2014-07-07 DIAGNOSIS — K219 Gastro-esophageal reflux disease without esophagitis: Secondary | ICD-10-CM

## 2014-07-07 DIAGNOSIS — I4891 Unspecified atrial fibrillation: Secondary | ICD-10-CM | POA: Diagnosis not present

## 2014-07-08 NOTE — Progress Notes (Signed)
Patient ID: Anne Armstrong, female   DOB: Oct 26, 1923, 81 y.o.   MRN: [id number]     [facility]   PCP: Imagene Riches, MD  Code Status: DNR  Allergies  Allergen Reactions  . Iodine     Unknown reaction per MAR   . Macrobid [Nitrofurantoin]     Unknown reaction per MAR   . Novocain [Procaine]     Unknown reaction per MAR   . Penicillins     Unknown reaction per MAR   . Shellfish Allergy     Unknown reaction per MAR   . Sulfa Antibiotics     Unknown reaction per [facility]     Chief Complaint  Patient presents with  . New Admit To SNF     HPI:  81 year old patient is here for short term rehabilitation post hospital admission from 07/02/14-07/05/14 with aspiration pneumonia/ HCAP. She was in the facility undergoing rehabilitation post infective endocarditis when she had to be readmitted to the hospital with HCAP/ aspiration pneumonia. She is seen in her room today. She is feeding herself. She denies any chest pain. She is on o2 and her breathing is comfortable.she feels that her strength is slowly improving. Her appetite is improving too. No concerns from staff.  She has hx of CAD, afib, HTN among others.  Review of Systems:  Constitutional: Negative for fever, chills, diaphoresis.  HENT: Negative for headache, congestion. Eyes: Negative for eye pain, blurred vision, double vision and discharge.  Respiratory: Negative for cough, shortness of breath and wheezing.   Cardiovascular: Negative for chest pain, palpitations, leg swelling.  Gastrointestinal: Negative for heartburn, nausea, vomiting, abdominal pain Genitourinary: Negative for dysuria Musculoskeletal: Negative for back pain, falls Skin: Negative for itching, rash.  Neurological: Negative for dizziness, tingling, focal weakness Psychiatric/Behavioral: Negative for depression    Past Medical History  Diagnosis Date  . Gastro-esophageal reflux   . Arthralgia   . A-fib   . Osteoarthritis   .  Pneumonia   . UTI (urinary tract infection)   . NSTEMI (non-ST elevated myocardial infarction) 05/26/14  . Heart murmur   . Stroke 05/26/14    Septic emboli to cerebral and cerebellar hemispheres  . SVT (supraventricular tachycardia)   . 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 CAD (coronary artery disease) 05/26/14  . Dysphagia 05/26/14  . Hypertension   . History of fall   . Basal cell carcinoma of face   . Debility    No past surgical history on file. Social History:   reports that she has never smoked. She has never used smokeless tobacco. She reports that she does not drink alcohol or use illicit drugs.  No family history on file.  Medications: Patient's Medications  New Prescriptions   No medications on file  Previous Medications   ASPIRIN 325 MG TABLET    Take 325 mg by mouth daily.   ATORVASTATIN (LIPITOR) 20 MG TABLET    Take 20 mg by mouth every evening.   CEFTRIAXONE 2 G IN DEXTROSE 5 % 50 ML    Inject 2 g into the vein daily. For 4 weeks-Please finish course as outlined by her Physicians in [facility]   GABAPENTIN (NEURONTIN) 300 MG CAPSULE    Take 300 mg by mouth 3 (three) times daily.    HYDRALAZINE (APRESOLINE) 25 MG TABLET    Take 25 mg by mouth 3 (three) times daily.  MECLIZINE (ANTIVERT) 12.5 MG TABLET    Take 1 tablet (12.5 mg total) by mouth 3 (three) times daily as needed for dizziness or nausea.   MECLIZINE (ANTIVERT) 25 MG TABLET    Take 25 mg by mouth 3 (three) times daily as needed for dizziness or nausea.    METOPROLOL TARTRATE (LOPRESSOR) 25 MG TABLET    Take 25 mg by mouth 2 (two) times daily.    OMEPRAZOLE (PRILOSEC) 40 MG CAPSULE    Take 1 capsule (40 mg total) by mouth daily before breakfast.   PROAIR HFA 108 (90 BASE) MCG/ACT INHALER    Inhale 1 puff into the lungs every 6 (six) hours as needed for wheezing or shortness of breath.    TRAMADOL (ULTRAM) 50 MG TABLET    Take one tablet  by mouth twice daily for pain; Take one tablet by mouth four times daily as needed for moderate pain  Modified Medications   No medications on file  Discontinued Medications   No medications on file     Physical Exam: Filed Vitals:   07/07/14 2155  BP: 132/69  Pulse: 73  Temp: 97 F (36.1 C)  Resp: 18  SpO2: 99%    General- elderly female, frail and ill appearing, in no acute distress Head- normocephalic, atraumatic Throat- moist mucus membrane Neck- no cervical lymphadenopathy Cardiovascular- normal s1,s2, no leg edema Respiratory- bilateral clear to auscultation, no wheeze, no rhonchi, no crackles, no use of accessory muscles, on o2 Abdomen- bowel sounds present, soft, non tender Musculoskeletal- able to move all 4 extremities, generalized weakness, right arm picc line  Neurological- no focal deficit Skin- warm and dry Psychiatry- alert and oriented to person, place and time, normal mood and affect    Labs reviewed: Basic Metabolic Panel:  Recent Labs  07/02/14 2324  NA 133*  K 4.3  CL 96  CO2 28  GLUCOSE 136*  BUN 21  CREATININE 0.81  CALCIUM 9.1   Liver Function Tests:  Recent Labs  07/02/14 2324  AST 59*  ALT 60*  ALKPHOS 127*  BILITOT 0.4  PROT 6.8  ALBUMIN 3.3*    Recent Labs  07/02/14 2324  LIPASE 61*   No results for input(s): AMMONIA in the last 8760 hours. CBC:  Recent Labs  07/02/14 2324 07/04/14 0445  WBC 12.5* 8.0  NEUTROABS 10.5*  --   HGB 11.2* 9.0*  HCT 34.0* 27.5*  MCV 86.7 86.8  PLT 292 209   Cardiac Enzymes:  Recent Labs  07/04/14 1946 07/05/14 0029 07/05/14 0523  TROPONINI 0.07* 0.09* 0.09*    Assessment/Plan  Physical deconditioning Will have her work with physical therapy and occupational therapy team to help with gait training and muscle strengthening exercises.fall precautions. Skin care. Encourage to be out of bed.   HCAP Clinically improved, continue o2 and wean off as tolerated.   Infective  endocarditis Reviewed last ID consult note in chart, rocephin course until 07/07/14. Will set f/u with ID as pt was supposed to have f/u on 07/04/14 but she was in the hospital. picc line care  GERD continue omeprazole 40 mg daily  Hypertension continue hydralazine 25 mg tid and Lopressor 25 mg bid  afib Rate controlled, continue lopressor 25 mg bid with aspirin  Hyperlipidemia continue Lipitor 20 mg daily   Goals of care: short term rehabilitation   Labs/tests ordered: cbc, bmp  Family/ staff Communication: reviewed care plan with patient and nursing supervisor    Blanchie Serve, MD  [facility] [phone] (Monday-Friday 8 am - 5 pm) [phone] (afterhours)

## 2014-07-09 LAB — CULTURE, BLOOD (ROUTINE X 2)
CULTURE: NO GROWTH
Culture: NO GROWTH

## 2014-07-14 ENCOUNTER — Other Ambulatory Visit: Payer: Self-pay | Admitting: *Deleted

## 2014-07-14 MED ORDER — TRAMADOL HCL 50 MG PO TABS: ORAL_TABLET | ORAL | Status: AC

## 2014-07-14 NOTE — Telephone Encounter (Signed)
[facility] # [phone] Fax: [phone] 

## 2014-08-24 ENCOUNTER — Non-Acute Institutional Stay (SKILLED_NURSING_FACILITY): Payer: Medicare Other | Admitting: Adult Health

## 2014-08-24 ENCOUNTER — Encounter: Payer: Self-pay | Admitting: Adult Health

## 2014-08-24 DIAGNOSIS — I10 Essential (primary) hypertension: Secondary | ICD-10-CM

## 2014-08-24 DIAGNOSIS — E785 Hyperlipidemia, unspecified: Secondary | ICD-10-CM | POA: Diagnosis not present

## 2014-08-24 DIAGNOSIS — K219 Gastro-esophageal reflux disease without esophagitis: Secondary | ICD-10-CM

## 2014-08-24 DIAGNOSIS — I330 Acute and subacute infective endocarditis: Secondary | ICD-10-CM | POA: Diagnosis not present

## 2014-08-24 DIAGNOSIS — I4891 Unspecified atrial fibrillation: Secondary | ICD-10-CM | POA: Diagnosis not present

## 2014-08-24 DIAGNOSIS — G629 Polyneuropathy, unspecified: Secondary | ICD-10-CM

## 2014-08-24 DIAGNOSIS — R5381 Other malaise: Secondary | ICD-10-CM | POA: Diagnosis not present

## 2014-08-24 NOTE — Progress Notes (Signed)
Patient ID: Anne Armstrong, female   DOB: 01-Dec-1923, 81 y.o.   MRN: [id number]   08/24/2014  Facility:  Nursing Home Location:  [place] Room Number: 407-P LEVEL OF CARE:  SNF (31)   Chief Complaint  Patient presents with  . Discharge Note    Physical deconditioning, infective endocarditis, GERD, hypertension, hyperlipidemia, neuropathy and atrial fibrillation    HISTORY OF PRESENT ILLNESS:  This is an 81 year old female who is for discharge home with Home health PT for strengthening, safe transfers and transition to independent living, OT for self care, toileting and transfer safely, Nursing for disease management and CNA for bathing assistance/activities of daily living.  DME: Lightweight wheelchair 16" X 18" with elevating leg rests, removable armrests, anti-tippers, wheelchair cushion and semi-electric hospital bed with half rails. She has been re-admitted to [facility] on 07/05/14 from [facility]. She has a history of bacterial endocarditis treated at [facility] in January. She was discharged on Rocephin IV while doing rehabilitation at [facility]. She was having nausea and was given Phenergan suppository. She then complained of belly and chest pain. She was then transferred to the hospital and was diagnosed with HCAP.  She has completed her Rocephin IV and PICC has been pulled. 2 D echo done on 07/02/14 shows no vegetation.  She was seen today in her room. She is happy that she is finally going home. No complaints of pain.  Patient was admitted to this facility for short-term rehabilitation after the patient's recent hospitalization.  Patient has completed SNF rehabilitation and therapy has cleared the patient for discharge.   PAST MEDICAL HISTORY:  Past Medical History  Diagnosis Date  . Gastro-esophageal reflux   . Arthralgia   . A-fib   . Osteoarthritis   . Pneumonia   . UTI (urinary tract infection)   . NSTEMI (non-ST  elevated myocardial infarction) 05/26/14  . Heart murmur   . Stroke 05/26/14    Septic emboli to cerebral and cerebellar hemispheres  . SVT (supraventricular tachycardia)   . 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 CAD (coronary artery disease) 05/26/14  . Dysphagia 05/26/14  . Hypertension   . History of fall   . Basal cell carcinoma of face   . Debility     CURRENT MEDICATIONS: Reviewed per MAR/see medication list  Allergies  Allergen Reactions  . Iodine     Unknown reaction per MAR   . Macrobid [Nitrofurantoin]     Unknown reaction per MAR   . Novocain [Procaine]     Unknown reaction per MAR   . Penicillins     Unknown reaction per MAR   . Shellfish Allergy     Unknown reaction per MAR   . Sulfa Antibiotics     Unknown reaction per [facility]      REVIEW OF SYSTEMS:  GENERAL: no change in appetite, no fever, chills  RESPIRATORY: no cough, SOB, DOE, wheezing, hemoptysis CARDIAC: no chest pain, or palpitations GI: no abdominal pain, diarrhea, constipation, heart burn, nausea or vomiting  PHYSICAL EXAMINATION  GENERAL: no acute distress, normal body habitus NECK: supple, trachea midline, no neck masses, no thyroid tenderness, no thyromegaly LYMPHATICS: no LAN in the neck, no supraclavicular LAN RESPIRATORY: breathing is even & unlabored, BS CTAB CARDIAC: RRR, no murmur,no extra heart sounds, BLE edema trace GI: abdomen soft, normal BS, no masses, no tenderness, no hepatomegaly, no splenomegaly EXTREMITIES: able  to move X 4 extremities; still has BLE weakness PSYCHIATRIC: the patient is alert & oriented to person, affect & behavior appropriate  LABS/RADIOLOGY: Labs reviewed: Basic Metabolic Panel:  Recent Labs  07/02/14 2324  NA 133*  K 4.3  CL 96  CO2 28  GLUCOSE 136*  BUN 21  CREATININE 0.81  CALCIUM 9.1   Liver Function Tests:  Recent Labs  07/02/14 2324  AST 59*  ALT 60*  ALKPHOS 127*  BILITOT 0.4   PROT 6.8  ALBUMIN 3.3*    Recent Labs  07/02/14 2324  LIPASE 61*    CBC:  Recent Labs  07/02/14 2324 07/04/14 0445  WBC 12.5* 8.0  NEUTROABS 10.5*  --   HGB 11.2* 9.0*  HCT 34.0* 27.5*  MCV 86.7 86.8  PLT 292 209   Cardiac Enzymes:  Recent Labs  07/04/14 1946 07/05/14 0029 07/05/14 0523  TROPONINI 0.07* 0.09* 0.09*    ASSESSMENT/PLAN:  Physical deconditioning - for home health PT, OT, Nursing and CNA Bacterial endocarditis - Resolved GERD - continue omeprazole 40 mg by mouth daily Hyperlipidemia - continue Lipitor 20 mg by mouth every evening Neuropathy - continue Neurontin will be decreased to 300 mg by mouth BID Hypertension - continue hydralazine 25 mg by mouth 3 times a day and Lopressor 25 mg by mouth twice a day Chronic atrial fibrillation - rate controlled; has a history of fall; continue aspirin 325 mg by mouth daily and Lopressor 25 mg by mouth twice a day   I have filled out patient's discharge paperwork and written prescriptions.  Patient will receive home health PT, OT, Nursing and CNA.  DME provided:  Lightweight wheelchair 16" X 18" with elevating leg rests, removable armrests, anti-tippers, wheelchair cushion and semi-electric hospital bed with half rails  Total discharge time: Greater than 30 minutes  Discharge time involved coordination of the discharge process with social worker, nursing staff and therapy department. Medical justification for home health services/DME verified.     [facility], NP [employer] [phone]

## 2014-08-30 ENCOUNTER — Emergency Department (HOSPITAL_BASED_OUTPATIENT_CLINIC_OR_DEPARTMENT_OTHER)
Admission: EM | Admit: 2014-08-30 | Discharge: 2014-08-30 | Disposition: A | Discharge status: Home / Self Care | Payer: Medicare Other | Attending: Emergency Medicine | Admitting: Emergency Medicine

## 2014-08-30 ENCOUNTER — Emergency Department (HOSPITAL_BASED_OUTPATIENT_CLINIC_OR_DEPARTMENT_OTHER): Payer: Medicare Other

## 2014-08-30 ENCOUNTER — Encounter (HOSPITAL_BASED_OUTPATIENT_CLINIC_OR_DEPARTMENT_OTHER): Payer: Self-pay

## 2014-08-30 DIAGNOSIS — I2510 Atherosclerotic heart disease of native coronary artery without angina pectoris: Secondary | ICD-10-CM | POA: Diagnosis not present

## 2014-08-30 DIAGNOSIS — Z8673 Personal history of transient ischemic attack (TIA), and cerebral infarction without residual deficits: Secondary | ICD-10-CM | POA: Insufficient documentation

## 2014-08-30 DIAGNOSIS — Z792 Long term (current) use of antibiotics: Secondary | ICD-10-CM | POA: Insufficient documentation

## 2014-08-30 DIAGNOSIS — M79672 Pain in left foot: Secondary | ICD-10-CM | POA: Diagnosis present

## 2014-08-30 DIAGNOSIS — K219 Gastro-esophageal reflux disease without esophagitis: Secondary | ICD-10-CM | POA: Insufficient documentation

## 2014-08-30 DIAGNOSIS — R011 Cardiac murmur, unspecified: Secondary | ICD-10-CM | POA: Diagnosis not present

## 2014-08-30 DIAGNOSIS — M1990 Unspecified osteoarthritis, unspecified site: Secondary | ICD-10-CM

## 2014-08-30 DIAGNOSIS — Z87440 Personal history of urinary (tract) infections: Secondary | ICD-10-CM | POA: Insufficient documentation

## 2014-08-30 DIAGNOSIS — Z79899 Other long term (current) drug therapy: Secondary | ICD-10-CM | POA: Insufficient documentation

## 2014-08-30 DIAGNOSIS — G629 Polyneuropathy, unspecified: Secondary | ICD-10-CM | POA: Diagnosis not present

## 2014-08-30 DIAGNOSIS — I10 Essential (primary) hypertension: Secondary | ICD-10-CM | POA: Diagnosis not present

## 2014-08-30 DIAGNOSIS — I4891 Unspecified atrial fibrillation: Secondary | ICD-10-CM | POA: Insufficient documentation

## 2014-08-30 DIAGNOSIS — Z8589 Personal history of malignant neoplasm of other organs and systems: Secondary | ICD-10-CM | POA: Diagnosis not present

## 2014-08-30 DIAGNOSIS — Z880 Allergy status to penicillin: Secondary | ICD-10-CM | POA: Insufficient documentation

## 2014-08-30 DIAGNOSIS — I252 Old myocardial infarction: Secondary | ICD-10-CM | POA: Diagnosis not present

## 2014-08-30 DIAGNOSIS — Z8701 Personal history of pneumonia (recurrent): Secondary | ICD-10-CM | POA: Insufficient documentation

## 2014-08-30 DIAGNOSIS — Z8669 Personal history of other diseases of the nervous system and sense organs: Secondary | ICD-10-CM

## 2014-08-30 DIAGNOSIS — Z9181 History of falling: Secondary | ICD-10-CM | POA: Insufficient documentation

## 2014-08-30 DIAGNOSIS — M19072 Primary osteoarthritis, left ankle and foot: Secondary | ICD-10-CM | POA: Diagnosis not present

## 2014-08-30 DIAGNOSIS — Z7982 Long term (current) use of aspirin: Secondary | ICD-10-CM | POA: Insufficient documentation

## 2014-08-30 MED ORDER — HYDROCODONE-ACETAMINOPHEN 5-325 MG PO TABS: 1.0000 | ORAL_TABLET | Freq: Four times a day (QID) | ORAL | Status: AC | PRN

## 2014-08-30 NOTE — Discharge Instructions (Signed)
Arthritis, Nonspecific °Arthritis is pain, redness, warmth, or puffiness (inflammation) of a joint. The joint may be stiff or hurt when you move it. One or more joints may be affected. There are many types of arthritis. Your doctor may not know what type you have right away. The most common cause of arthritis is wear and tear on the joint (osteoarthritis). °HOME CARE  °· Only take medicine as told by your doctor. °· Rest the joint as much as possible. °· Raise (elevate) your joint if it is puffy. °· Use crutches if the painful joint is in your leg. °· Drink enough fluids to keep your pee (urine) clear or pale yellow. °· Follow your doctor's diet instructions. °· Use cold packs for very bad joint pain for 10 to 15 minutes every hour. Ask your doctor if it is okay for you to use hot packs. °· Exercise as told by your doctor. °· Take a warm shower if you have stiffness in the morning. °· Move your sore joints throughout the day. °GET HELP RIGHT AWAY IF:  °· You have a fever. °· You have very bad joint pain, puffiness, or redness. °· You have many joints that are painful and puffy. °· You are not getting better with treatment. °· You have very bad back pain or leg weakness. °· You cannot control when you poop (bowel movement) or pee (urinate). °· You do not feel better in 24 hours or are getting worse. °· You are having side effects from your medicine. °MAKE SURE YOU:  °· Understand these instructions. °· Will watch your condition. °· Will get help right away if you are not doing well or get worse. °Document Released: 07/09/2009 Document Revised: 10/14/2011 Document Reviewed: 07/09/2009 °[employer]® Patient Information ©2015 [employer]. This information is not intended to replace advice given to you by your health care provider. Make sure you discuss any questions you have with your health care provider. ° °

## 2014-08-30 NOTE — ED Notes (Signed)
Per EMS pt from [facility]; pt states had lt foot pain a few days ago, denies injury, denies pain at this time

## 2014-08-30 NOTE — ED Provider Notes (Signed)
CSN: [id number]     Arrival date & time 08/30/14  0327 History   First MD Initiated Contact with Patient 08/30/14 [phone]     Chief Complaint  Patient presents with  . Foot Pain     (Consider location/radiation/quality/duration/timing/severity/associated sxs/prior Treatment) HPI  This is an 81 year old female with history of atrial fibrillation, UTI, stroke, recent endocarditis who presents with left foot pain. Patient reports onset of symptoms several days ago with worsening of symptoms over the last several hours. Patient does not ambulate and gets around in a wheelchair. Reports pain to her left heel. Husband is at the bedside and states that they have not slept tonight. Patient denies any injury or swelling. Currently patient denies any pain and states that it improved upon EMS arrival. Patient has a history of arthritis and neuropathy in that foot. Patient denies any fevers or other symptoms.  Patient reports that she occasionally gets pain similar to this at night but tonight it was worse.  Past Medical History  Diagnosis Date  . Gastro-esophageal reflux   . Arthralgia   . A-fib   . Osteoarthritis   . Pneumonia   . UTI (urinary tract infection)   . NSTEMI (non-ST elevated myocardial infarction) 05/26/14  . Heart murmur   . Stroke 05/26/14    Septic emboli to cerebral and cerebellar hemispheres  . SVT (supraventricular tachycardia)   . 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 CAD (coronary artery disease) 05/26/14  . Dysphagia 05/26/14  . Hypertension   . History of fall   . Basal cell carcinoma of face   . Debility    History reviewed. No pertinent past surgical history. No family history on file. History  Substance Use Topics  . Smoking status: Never Smoker   . Smokeless tobacco: Never Used  . Alcohol Use: No   OB History    No data available     Review of Systems  Constitutional: Negative for fever.  Respiratory:  Negative for chest tightness and shortness of breath.   Cardiovascular: Negative for chest pain.  Gastrointestinal: Negative for abdominal pain.  Musculoskeletal:       Left foot pain  Skin: Negative for rash and wound.  All other systems reviewed and are negative.     Allergies  Iodine; Macrobid; Novocain; Penicillins; Shellfish allergy; and Sulfa antibiotics  Home Medications   Prior to Admission medications   Medication Sig Start Date End Date Taking? Authorizing Provider  aspirin 325 MG tablet Take 325 mg by mouth daily.    Historical Provider, MD  atorvastatin (LIPITOR) 20 MG tablet Take 20 mg by mouth every evening.    Historical Provider, MD  cefTRIAXone 2 g in dextrose 5 % 50 mL Inject 2 g into the vein daily. For 4 weeks-Please finish course as outlined by her Physicians in [facility] 07/05/14   Jonetta Osgood, MD  gabapentin (NEURONTIN) 300 MG capsule Take 300 mg by mouth 3 (three) times daily.     Historical Provider, MD  hydrALAZINE (APRESOLINE) 25 MG tablet Take 25 mg by mouth 3 (three) times daily.    Historical Provider, MD  HYDROcodone-acetaminophen (NORCO/VICODIN) 5-325 MG per tablet Take 1 tablet by mouth every 6 (six) hours as needed. 08/30/14   Merryl Hacker, MD  meclizine (ANTIVERT) 12.5 MG tablet Take 1 tablet (12.5 mg total) by mouth 3 (three) times daily as needed for dizziness or nausea. 06/14/13   Jenny Reichmann  Molpus, MD  metoprolol tartrate (LOPRESSOR) 25 MG tablet Take 25 mg by mouth 2 (two) times daily.  06/26/12   Historical Provider, MD  omeprazole (PRILOSEC) 40 MG capsule Take 1 capsule (40 mg total) by mouth daily before breakfast. 07/05/14   Jonetta Osgood, MD  PROAIR HFA 108 (90 BASE) MCG/ACT inhaler Inhale 1 puff into the lungs every 6 (six) hours as needed for wheezing or shortness of breath.  03/09/13   Historical Provider, MD  traMADol (ULTRAM) 50 MG tablet Take one tablet by mouth twice daily for pain; Take one tablet by mouth four times  daily as needed for moderate pain 07/14/14   Gildardo Cranker, DO   BP 137/65 mmHg  Pulse 74  Temp(Src) 98.2 F (36.8 C) (Oral)  Resp 20  SpO2 96% Physical Exam  Constitutional: She is oriented to person, place, and time. No distress.  Elderly  HENT:  Head: Normocephalic and atraumatic.  Cardiovascular: Normal rate and regular rhythm.   Pulmonary/Chest: Effort normal. No respiratory distress.  Abdominal: Soft. There is no tenderness.  Musculoskeletal: She exhibits no edema.  Focused examination of the left lower extremity; no obvious deformity, 2+ DP pulse, no tenderness to palpation, no midfoot tenderness, baseline range of motion and strength to the foot and ankle, no overlying skin changes  Neurological: She is alert and oriented to person, place, and time.  Skin: Skin is warm and dry.  Psychiatric: She has a normal mood and affect.  Nursing note and vitals reviewed.   ED Course  Procedures (including critical care time) Labs Review Labs Reviewed - No data to display  Imaging Review Dg Foot Complete Left  08/30/2014   CLINICAL DATA:  Left foot pain several days ago.  No trauma.  EXAM: LEFT FOOT - COMPLETE 3+ VIEW  COMPARISON:  None.  FINDINGS: Negative for fracture, dislocation or radiopaque foreign body. Moderate arthritic changes are present about the interphalangeal joints. There is no bone lesion or bony destruction.  IMPRESSION: Mild interphalangeal joint arthritic changes. No acute findings are evident.   Electronically Signed   By: Andreas Newport M.D.   On: 08/30/2014 04:16     EKG Interpretation None      MDM   Final diagnoses:  Arthritis  History of neuropathy   patient presents with left foot pain. Pain resolved upon presentation. Exam is unremarkable. Patient has a history of arthritis and neuropathy. Screening plain films obtained and negative for acute injury. Suspect either arthritis, neuropathy, or pain secondary to pressure. No signs of pressure ulcer or  skin breakdown. Patient given Norco for pain. She is to follow-up with her primary physician.  After history, exam, and medical workup I feel the patient has been appropriately medically screened and is safe for discharge home. Pertinent diagnoses were discussed with the patient. Patient was given return precautions.   Merryl Hacker, MD 08/30/14 [phone]

## 2014-09-26 ENCOUNTER — Emergency Department (HOSPITAL_COMMUNITY): Payer: Medicare Other

## 2014-09-26 ENCOUNTER — Encounter (HOSPITAL_COMMUNITY): Payer: Self-pay | Admitting: *Deleted

## 2014-09-26 ENCOUNTER — Observation Stay (HOSPITAL_COMMUNITY)
Admission: EM | Admit: 2014-09-26 | Discharge: 2014-09-28 | Payer: Medicare Other | Attending: Internal Medicine | Admitting: Internal Medicine

## 2014-09-26 DIAGNOSIS — R5383 Other fatigue: Secondary | ICD-10-CM | POA: Insufficient documentation

## 2014-09-26 DIAGNOSIS — N390 Urinary tract infection, site not specified: Secondary | ICD-10-CM | POA: Insufficient documentation

## 2014-09-26 DIAGNOSIS — K5641 Fecal impaction: Secondary | ICD-10-CM | POA: Insufficient documentation

## 2014-09-26 DIAGNOSIS — Z91041 Radiographic dye allergy status: Secondary | ICD-10-CM | POA: Insufficient documentation

## 2014-09-26 DIAGNOSIS — N179 Acute kidney failure, unspecified: Secondary | ICD-10-CM | POA: Diagnosis not present

## 2014-09-26 DIAGNOSIS — Z880 Allergy status to penicillin: Secondary | ICD-10-CM | POA: Diagnosis not present

## 2014-09-26 DIAGNOSIS — R5381 Other malaise: Secondary | ICD-10-CM | POA: Diagnosis not present

## 2014-09-26 DIAGNOSIS — Z8701 Personal history of pneumonia (recurrent): Secondary | ICD-10-CM | POA: Insufficient documentation

## 2014-09-26 DIAGNOSIS — Z85828 Personal history of other malignant neoplasm of skin: Secondary | ICD-10-CM | POA: Insufficient documentation

## 2014-09-26 DIAGNOSIS — M25512 Pain in left shoulder: Secondary | ICD-10-CM | POA: Diagnosis not present

## 2014-09-26 DIAGNOSIS — I4891 Unspecified atrial fibrillation: Secondary | ICD-10-CM | POA: Insufficient documentation

## 2014-09-26 DIAGNOSIS — I252 Old myocardial infarction: Secondary | ICD-10-CM | POA: Insufficient documentation

## 2014-09-26 DIAGNOSIS — K219 Gastro-esophageal reflux disease without esophagitis: Secondary | ICD-10-CM | POA: Diagnosis not present

## 2014-09-26 DIAGNOSIS — E785 Hyperlipidemia, unspecified: Secondary | ICD-10-CM | POA: Diagnosis not present

## 2014-09-26 DIAGNOSIS — I2510 Atherosclerotic heart disease of native coronary artery without angina pectoris: Secondary | ICD-10-CM | POA: Diagnosis not present

## 2014-09-26 DIAGNOSIS — Z91013 Allergy to seafood: Secondary | ICD-10-CM | POA: Diagnosis not present

## 2014-09-26 DIAGNOSIS — Z91018 Allergy to other foods: Secondary | ICD-10-CM | POA: Insufficient documentation

## 2014-09-26 DIAGNOSIS — Z7982 Long term (current) use of aspirin: Secondary | ICD-10-CM | POA: Insufficient documentation

## 2014-09-26 DIAGNOSIS — Z888 Allergy status to other drugs, medicaments and biological substances status: Secondary | ICD-10-CM | POA: Diagnosis not present

## 2014-09-26 DIAGNOSIS — Z8673 Personal history of transient ischemic attack (TIA), and cerebral infarction without residual deficits: Secondary | ICD-10-CM | POA: Insufficient documentation

## 2014-09-26 DIAGNOSIS — R4182 Altered mental status, unspecified: Principal | ICD-10-CM | POA: Insufficient documentation

## 2014-09-26 DIAGNOSIS — Z882 Allergy status to sulfonamides status: Secondary | ICD-10-CM | POA: Insufficient documentation

## 2014-09-26 DIAGNOSIS — I10 Essential (primary) hypertension: Secondary | ICD-10-CM | POA: Insufficient documentation

## 2014-09-26 DIAGNOSIS — R4189 Other symptoms and signs involving cognitive functions and awareness: Secondary | ICD-10-CM

## 2014-09-26 LAB — I-STAT CHEM 8, ED
BUN: 30 mg/dL — AB (ref 6–20)
CALCIUM ION: 1.43 mmol/L — AB (ref 1.13–1.30)
CHLORIDE: 103 mmol/L (ref 101–111)
CREATININE: 1.3 mg/dL — AB (ref 0.44–1.00)
Glucose, Bld: 108 mg/dL — ABNORMAL HIGH (ref 65–99)
HCT: 32 % — ABNORMAL LOW (ref 36.0–46.0)
Hemoglobin: 10.9 g/dL — ABNORMAL LOW (ref 12.0–15.0)
Potassium: 4 mmol/L (ref 3.5–5.1)
Sodium: 141 mmol/L (ref 135–145)
TCO2: 24 mmol/L (ref 0–100)

## 2014-09-26 LAB — URINALYSIS, ROUTINE W REFLEX MICROSCOPIC
BILIRUBIN URINE: NEGATIVE
Glucose, UA: NEGATIVE mg/dL
Hgb urine dipstick: NEGATIVE
Ketones, ur: NEGATIVE mg/dL
Nitrite: NEGATIVE
PH: 6.5 (ref 5.0–8.0)
Protein, ur: NEGATIVE mg/dL
SPECIFIC GRAVITY, URINE: 1.015 (ref 1.005–1.030)
UROBILINOGEN UA: 0.2 mg/dL (ref 0.0–1.0)

## 2014-09-26 LAB — COMPREHENSIVE METABOLIC PANEL
ALBUMIN: 3 g/dL — AB (ref 3.5–5.0)
ALT: 14 U/L (ref 14–54)
AST: 19 U/L (ref 15–41)
Alkaline Phosphatase: 88 U/L (ref 38–126)
Anion gap: 10 (ref 5–15)
BILIRUBIN TOTAL: 0.7 mg/dL (ref 0.3–1.2)
BUN: 30 mg/dL — AB (ref 6–20)
CALCIUM: 10.7 mg/dL — AB (ref 8.9–10.3)
CHLORIDE: 105 mmol/L (ref 101–111)
CO2: 26 mmol/L (ref 22–32)
CREATININE: 1.25 mg/dL — AB (ref 0.44–1.00)
GFR calc Af Amer: 43 mL/min — ABNORMAL LOW (ref >60)
GFR calc non Af Amer: 37 mL/min — ABNORMAL LOW (ref >60)
Glucose, Bld: 109 mg/dL — ABNORMAL HIGH (ref 65–99)
Potassium: 4 mmol/L (ref 3.5–5.1)
Sodium: 141 mmol/L (ref 135–145)
Total Protein: 5.7 g/dL — ABNORMAL LOW (ref 6.5–8.1)

## 2014-09-26 LAB — URINE MICROSCOPIC-ADD ON

## 2014-09-26 LAB — CBC WITH DIFFERENTIAL/PLATELET
BASOS PCT: 0 % (ref 0–1)
Basophils Absolute: 0 10*3/uL (ref 0.0–0.1)
EOS ABS: 0.7 10*3/uL (ref 0.0–0.7)
EOS PCT: 8 % — AB (ref 0–5)
HEMATOCRIT: 31.8 % — AB (ref 36.0–46.0)
Hemoglobin: 10.4 g/dL — ABNORMAL LOW (ref 12.0–15.0)
Lymphocytes Relative: 20 % (ref 12–46)
Lymphs Abs: 1.8 10*3/uL (ref 0.7–4.0)
MCH: 26.3 pg (ref 26.0–34.0)
MCHC: 32.7 g/dL (ref 30.0–36.0)
MCV: 80.3 fL (ref 78.0–100.0)
MONO ABS: 1.3 10*3/uL — AB (ref 0.1–1.0)
MONOS PCT: 14 % — AB (ref 3–12)
NEUTROS ABS: 5.4 10*3/uL (ref 1.7–7.7)
Neutrophils Relative %: 58 % (ref 43–77)
Platelets: 246 10*3/uL (ref 150–400)
RBC: 3.96 MIL/uL (ref 3.87–5.11)
RDW: 14.4 % (ref 11.5–15.5)
WBC: 9.2 10*3/uL (ref 4.0–10.5)

## 2014-09-26 LAB — CBG MONITORING, ED: Glucose-Capillary: 99 mg/dL (ref 65–99)

## 2014-09-26 LAB — I-STAT TROPONIN, ED: TROPONIN I, POC: 0 ng/mL (ref 0.00–0.08)

## 2014-09-26 MED ORDER — SODIUM CHLORIDE 0.9 % IV BOLUS (SEPSIS)
500.0000 mL | Freq: Once | INTRAVENOUS | Status: AC
  Administered 2014-09-27: 500 mL via INTRAVENOUS

## 2014-09-26 MED ORDER — SODIUM CHLORIDE 0.9 % IV BOLUS (SEPSIS): 1000.0000 mL | Freq: Once | INTRAVENOUS | Status: DC

## 2014-09-26 MED ORDER — CIPROFLOXACIN IN D5W 400 MG/200ML IV SOLN
400.0000 mg | Freq: Once | INTRAVENOUS | Status: AC
  Administered 2014-09-27: 400 mg via INTRAVENOUS
  Filled 2014-09-26: qty 200

## 2014-09-26 NOTE — ED Provider Notes (Signed)
CSN: [id number]     Arrival date & time 09/26/14  2033 History   First MD Initiated Contact with Patient 09/26/14 2035     Chief Complaint  Patient presents with  . Altered Mental Status     (Consider location/radiation/quality/duration/timing/severity/associated sxs/prior Treatment) HPI  The patient is an 81 year old female, she has a history of atrial fibrillation, non-ST elevation MI from January 2016, diagnosed with infective endocarditis approximately the same time and had a PICC line placed for anti-biotic's which she completed uneventfully. She presents back to the hospital today after the family members noted that she was completely unresponsive at her independent care facility. They state that she was up until 2:00 last night with pain in her left shoulder and her chest which the family member state is quite common for her to have this intermittently, her pain always radiates to her shoulder and it is presumed to be esophagitis per the family members. They are unclear of the exact test that have been done in the past to make this diagnosis. That being said she did have a non-ST elevation MI which was thought to be related to demand ischemia when the patient had recently.  Past Medical History  Diagnosis Date  . Gastro-esophageal reflux   . Arthralgia   . A-fib   . Osteoarthritis   . Pneumonia   . UTI (urinary tract infection)   . NSTEMI (non-ST elevated myocardial infarction) 05/26/14  . Heart murmur   . Stroke 05/26/14    Septic emboli to cerebral and cerebellar hemispheres  . SVT (supraventricular tachycardia)   . 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 CAD (coronary artery disease) 05/26/14  . Dysphagia 05/26/14  . Hypertension   . History of fall   . Basal cell carcinoma of face   . Debility    History reviewed. No pertinent past surgical history. History reviewed. No pertinent family history. History  Substance Use  Topics  . Smoking status: Never Smoker   . Smokeless tobacco: Never Used  . Alcohol Use: No   OB History    No data available     Review of Systems  Unable to perform ROS: Mental status change      Allergies  Other; Iodine; Macrobid; Novocain; Penicillins; Shellfish allergy; and Sulfa antibiotics  Home Medications   Prior to Admission medications   Medication Sig Start Date End Date Taking? Authorizing Provider  aspirin EC 81 MG tablet Take 81 mg by mouth daily.   Yes Historical Provider, MD  atorvastatin (LIPITOR) 20 MG tablet Take 20 mg by mouth every evening.   Yes Historical Provider, MD  calcium carbonate (TUMS - DOSED IN MG ELEMENTAL CALCIUM) 500 MG chewable tablet Chew 1-2 tablets by mouth daily.   Yes Historical Provider, MD  Calcium Carbonate-Vitamin D (CALTRATE 600+D PO) Take 1 tablet by mouth daily.   Yes Historical Provider, MD  docusate sodium (COLACE) 100 MG capsule Take 100 mg by mouth daily.   Yes Historical Provider, MD  gabapentin (NEURONTIN) 300 MG capsule Take 300 mg by mouth 2 (two) times daily.    Yes Historical Provider, MD  hydrALAZINE (APRESOLINE) 25 MG tablet Take 25 mg by mouth 2 (two) times daily.    Yes Historical Provider, MD  metoprolol tartrate (LOPRESSOR) 25 MG tablet Take 25 mg by mouth 2 (two) times daily.  06/26/12  Yes Historical Provider, MD  omeprazole (PRILOSEC) 40 MG capsule Take 1 capsule (40 mg total)  by mouth daily before breakfast. Patient taking differently: Take 20 mg by mouth 2 (two) times daily.  07/05/14  Yes Shanker Kristeen Mans, MD  promethazine (PHENERGAN) 25 MG tablet Take 25 mg by mouth every 6 (six) hours as needed for nausea or vomiting.   Yes Historical Provider, MD  traMADol (ULTRAM) 50 MG tablet Take one tablet by mouth twice daily for pain; Take one tablet by mouth four times daily as needed for moderate pain 07/14/14  Yes Gildardo Cranker, DO  cefTRIAXone 2 g in dextrose 5 % 50 mL Inject 2 g into the vein daily. For 4 weeks-Please  finish course as outlined by her Physicians in [facility] 07/05/14   Jonetta Osgood, MD  HYDROcodone-acetaminophen (NORCO/VICODIN) 5-325 MG per tablet Take 1 tablet by mouth every 6 (six) hours as needed. 08/30/14   Merryl Hacker, MD  meclizine (ANTIVERT) 12.5 MG tablet Take 1 tablet (12.5 mg total) by mouth 3 (three) times daily as needed for dizziness or nausea. 06/14/13   John Molpus, MD  PROAIR HFA 108 (90 BASE) MCG/ACT inhaler Inhale 1 puff into the lungs every 6 (six) hours as needed for wheezing or shortness of breath.  03/09/13   Historical Provider, MD   BP 169/68 mmHg  Pulse 71  Temp(Src) 97.5 F (36.4 C) (Oral)  Resp 21  SpO2 100% Physical Exam  Constitutional: She appears well-developed and well-nourished. No distress.  HENT:  Head: Normocephalic and atraumatic.  Mouth/Throat: Oropharynx is clear and moist. No oropharyngeal exudate.  Eyes: Conjunctivae and EOM are normal. Pupils are equal, round, and reactive to light. Right eye exhibits no discharge. Left eye exhibits no discharge. No scleral icterus.  Neck: Normal range of motion. Neck supple. No JVD present. No thyromegaly present.  Cardiovascular: Normal rate, regular rhythm and intact distal pulses.  Exam reveals no gallop and no friction rub.   Murmur ( Soft heart murmur) heard. Pulmonary/Chest: Effort normal and breath sounds normal. No respiratory distress. She has no wheezes. She has no rales.  Abdominal: Soft. Bowel sounds are normal. She exhibits no distension and no mass. There is no tenderness.  Musculoskeletal: Normal range of motion. She exhibits no edema or tenderness.  Lymphadenopathy:    She has no cervical adenopathy.  Neurological: She is alert. Coordination normal.  Answers my questions appropriately, able to lift both of her legs off the bed with some difficulty, follows commands slowly but appropriately  Skin: Skin is warm and dry. No rash noted. No erythema.  Psychiatric: She has a  normal mood and affect. Her behavior is normal.  Nursing note and vitals reviewed.   ED Course  Procedures (including critical care time) Labs Review Labs Reviewed  CBC WITH DIFFERENTIAL/PLATELET - Abnormal; Notable for the following:    Hemoglobin 10.4 (*)    HCT 31.8 (*)    Monocytes Relative 14 (*)    Monocytes Absolute 1.3 (*)    Eosinophils Relative 8 (*)    All other components within normal limits  URINALYSIS, ROUTINE W REFLEX MICROSCOPIC (NOT AT [facility]) - Abnormal; Notable for the following:    APPearance CLOUDY (*)    Leukocytes, UA LARGE (*)    All other components within normal limits  COMPREHENSIVE METABOLIC PANEL - Abnormal; Notable for the following:    Glucose, Bld 109 (*)    BUN 30 (*)    Creatinine, Ser 1.25 (*)    Calcium 10.7 (*)    Total Protein 5.7 (*)  Albumin 3.0 (*)    GFR calc non Af Amer 37 (*)    GFR calc Af Amer 43 (*)    All other components within normal limits  URINE MICROSCOPIC-ADD ON - Abnormal; Notable for the following:    Bacteria, UA MANY (*)    All other components within normal limits  I-STAT CHEM 8, ED - Abnormal; Notable for the following:    BUN 30 (*)    Creatinine, Ser 1.30 (*)    Glucose, Bld 108 (*)    Calcium, Ion 1.43 (*)    Hemoglobin 10.9 (*)    HCT 32.0 (*)    All other components within normal limits  URINE CULTURE  CBG MONITORING, ED  I-STAT TROPOININ, ED    Imaging Review Ct Head Wo Contrast  09/26/2014   CLINICAL DATA:  81 year old female with altered mental status.  EXAM: CT HEAD WITHOUT CONTRAST  TECHNIQUE: Contiguous axial images were obtained from the base of the skull through the vertex without intravenous contrast.  COMPARISON:  07/02/2014 and prior exams  FINDINGS: Moderate atrophy and chronic small-vessel white matter ischemic changes again noted. Remote basal ganglia and cerebellar infarcts are again noted.  No acute intracranial abnormalities are identified, including mass lesion or mass effect,  hydrocephalus, extra-axial fluid collection, midline shift, hemorrhage, or acute infarction.  The visualized bony calvarium is unremarkable.  IMPRESSION: No evidence of acute intracranial abnormality.  Atrophy, chronic small-vessel white matter ischemic changes and remote basal ganglia and cerebellar infarcts.   Electronically Signed   By: Margarette Canada M.D.   On: 09/26/2014 22:02   Dg Chest Port 1 View  09/26/2014   CLINICAL DATA:  81 year old female with altered mental status. Found unresponsive.  EXAM: PORTABLE CHEST - 1 VIEW  COMPARISON:  07/02/2014  FINDINGS: Cardiomegaly is unchanged allowing for differences in technique. There is improved left basilar aeration with minimal residual atelectasis. Mild atelectasis or scarring at the right lung base. No new consolidation. The right-sided central line has been removed. No pulmonary edema, pleural effusion, or pneumothorax. No acute osseous abnormalities are seen.  IMPRESSION: Stable cardiomegaly. Improved left base aeration. Stable scarring or atelectasis at the right lung base.   Electronically Signed   By: Jeb Levering M.D.   On: 09/26/2014 21:37     EKG Interpretation   Date/Time:  Tuesday Sep 26 2014 20:42:07 EDT Ventricular Rate:  68 PR Interval:  200 QRS Duration: 147 QT Interval:  440 QTC Calculation: 468 R Axis:   23 Text Interpretation:  Sinus rhythm Atrial premature complex Right bundle  branch block Since last tracing rate slower Abnormal ekg Confirmed by  Sabra Heck  MD, Shatonya Passon ([id number]) on 09/26/2014 9:00:22 PM      MDM   Final diagnoses:  Altered mental state  UTI (lower urinary tract infection)    The patient has normal vital signs, she does not appear acutely ill, according to the family members this and she got on the ambulance and they started an IV she had no response, they considered intubation as the patient was spontaneously breathing but had such a low Glasgow Coma Score but very quickly within transportation she  improved and currently at this time seems to be at baseline. I spoke with the daughter extensively regarding the need for establishing resuscitative orders, she states that she would not want to be intubated, she would not want Cardopa very resuscitation, she would want a trial of defibrillation or cardioversion if required.  The patient has abnormal laboratory  findings with mild anemia, mild renal insufficiency and a new urinary tract infection with large leukocytes, many bacteria, 21-50 white blood cells seen under microscopic. EKG unremarkable, CT scan findings unremarkable, the patient will need to be admitted due to decreased level of consciousness and inability to tolerate oral medication safely with her somnolence. Medications given as below  UTI present, d/w family re: admission - observation admission preferred given history and magnitude of prior altered MS from earlier today.   D/w Dr. Humphrey Rolls who will admit.   Meds given in ED:  Medications  ciprofloxacin (CIPRO) IVPB 400 mg (400 mg Intravenous New Bag/Given 09/27/14 0016)  sodium chloride 0.9 % bolus 500 mL (500 mLs Intravenous New Bag/Given 09/27/14 0017)       Noemi Chapel, MD 09/27/14 [phone]

## 2014-09-26 NOTE — ED Notes (Signed)
Patient transported to CT 

## 2014-09-26 NOTE — ED Notes (Signed)
Pt in via EMS from home- last night pt was c/o left shoulder pain and fatigue, pt went to bed and stayed in bed all day, tonight when family attempted to arouse her the patient was unresponsive, pt was still unresponsive when EMS arrived, they got the patient into the truck and started an IV, pt flinched to that, EMS inserted blade into patients mouth to intubate her and she woke up, became completely responsive, states she was sleeping- pt alert and oriented, states she is tired, pt denies pain at this time, CBG 125. Pt VSS during all of this, no change in VS from unresponsive to being alert

## 2014-09-27 DIAGNOSIS — N390 Urinary tract infection, site not specified: Secondary | ICD-10-CM | POA: Diagnosis not present

## 2014-09-27 DIAGNOSIS — N179 Acute kidney failure, unspecified: Secondary | ICD-10-CM | POA: Diagnosis not present

## 2014-09-27 DIAGNOSIS — I4891 Unspecified atrial fibrillation: Secondary | ICD-10-CM

## 2014-09-27 DIAGNOSIS — R404 Transient alteration of awareness: Secondary | ICD-10-CM | POA: Diagnosis not present

## 2014-09-27 DIAGNOSIS — I482 Chronic atrial fibrillation: Secondary | ICD-10-CM | POA: Diagnosis not present

## 2014-09-27 DIAGNOSIS — R4182 Altered mental status, unspecified: Secondary | ICD-10-CM | POA: Diagnosis not present

## 2014-09-27 DIAGNOSIS — E785 Hyperlipidemia, unspecified: Secondary | ICD-10-CM

## 2014-09-27 DIAGNOSIS — R4189 Other symptoms and signs involving cognitive functions and awareness: Secondary | ICD-10-CM

## 2014-09-27 DIAGNOSIS — I10 Essential (primary) hypertension: Secondary | ICD-10-CM | POA: Diagnosis not present

## 2014-09-27 LAB — COMPREHENSIVE METABOLIC PANEL
ALK PHOS: 103 U/L (ref 38–126)
ALT: 15 U/L (ref 14–54)
ANION GAP: 14 (ref 5–15)
AST: 39 U/L (ref 15–41)
Albumin: 3.2 g/dL — ABNORMAL LOW (ref 3.5–5.0)
BUN: 29 mg/dL — ABNORMAL HIGH (ref 6–20)
CALCIUM: 10.6 mg/dL — AB (ref 8.9–10.3)
CO2: 22 mmol/L (ref 22–32)
Chloride: 103 mmol/L (ref 101–111)
Creatinine, Ser: 1.26 mg/dL — ABNORMAL HIGH (ref 0.44–1.00)
GFR calc Af Amer: 42 mL/min — ABNORMAL LOW (ref >60)
GFR, EST NON AFRICAN AMERICAN: 37 mL/min — AB (ref >60)
GLUCOSE: 143 mg/dL — AB (ref 65–99)
Potassium: 4.2 mmol/L (ref 3.5–5.1)
Sodium: 139 mmol/L (ref 135–145)
Total Bilirubin: 0.9 mg/dL (ref 0.3–1.2)
Total Protein: 6.7 g/dL (ref 6.5–8.1)

## 2014-09-27 LAB — CBC
HCT: 36.4 % (ref 36.0–46.0)
Hemoglobin: 12 g/dL (ref 12.0–15.0)
MCH: 26.7 pg (ref 26.0–34.0)
MCHC: 33 g/dL (ref 30.0–36.0)
MCV: 81.1 fL (ref 78.0–100.0)
PLATELETS: 243 10*3/uL (ref 150–400)
RBC: 4.49 MIL/uL (ref 3.87–5.11)
RDW: 14.5 % (ref 11.5–15.5)
WBC: 10 10*3/uL (ref 4.0–10.5)

## 2014-09-27 LAB — CREATININE, SERUM
CREATININE: 1.26 mg/dL — AB (ref 0.44–1.00)
GFR calc Af Amer: 42 mL/min — ABNORMAL LOW (ref >60)
GFR calc non Af Amer: 37 mL/min — ABNORMAL LOW (ref >60)

## 2014-09-27 LAB — PROTIME-INR
INR: 1.19 (ref 0.00–1.49)
Prothrombin Time: 15.3 seconds — ABNORMAL HIGH (ref 11.6–15.2)

## 2014-09-27 LAB — GLUCOSE, CAPILLARY: GLUCOSE-CAPILLARY: 110 mg/dL — AB (ref 65–99)

## 2014-09-27 LAB — TSH: TSH: 1.51 u[IU]/mL (ref 0.350–4.500)

## 2014-09-27 LAB — MRSA PCR SCREENING: MRSA BY PCR: POSITIVE — AB

## 2014-09-27 MED ORDER — ACETAMINOPHEN 650 MG RE SUPP: 650.0000 mg | Freq: Four times a day (QID) | RECTAL | Status: DC | PRN

## 2014-09-27 MED ORDER — TRAMADOL HCL 50 MG PO TABS: 50.0000 mg | ORAL_TABLET | Freq: Two times a day (BID) | ORAL | Status: DC | PRN

## 2014-09-27 MED ORDER — POLYETHYLENE GLYCOL 3350 17 G PO PACK: 17.0000 g | PACK | Freq: Every day | ORAL | Status: DC | PRN

## 2014-09-27 MED ORDER — OXYCODONE HCL 5 MG PO TABS: 5.0000 mg | ORAL_TABLET | ORAL | Status: DC | PRN

## 2014-09-27 MED ORDER — HYDRALAZINE HCL 25 MG PO TABS
25.0000 mg | ORAL_TABLET | Freq: Two times a day (BID) | ORAL | Status: DC
  Administered 2014-09-27 – 2014-09-28 (×3): 25 mg via ORAL
  Filled 2014-09-27 (×3): qty 1

## 2014-09-27 MED ORDER — CEFTRIAXONE SODIUM IN DEXTROSE 20 MG/ML IV SOLN
1.0000 g | Freq: Every day | INTRAVENOUS | Status: DC
  Administered 2014-09-27: 1 g via INTRAVENOUS
  Filled 2014-09-27 (×2): qty 50

## 2014-09-27 MED ORDER — PANTOPRAZOLE SODIUM 40 MG PO TBEC
80.0000 mg | DELAYED_RELEASE_TABLET | Freq: Every day | ORAL | Status: DC
  Administered 2014-09-27: 80 mg via ORAL
  Filled 2014-09-27: qty 2

## 2014-09-27 MED ORDER — ONDANSETRON HCL 4 MG PO TABS: 4.0000 mg | ORAL_TABLET | Freq: Four times a day (QID) | ORAL | Status: DC | PRN

## 2014-09-27 MED ORDER — MUPIROCIN 2 % EX OINT
1.0000 "application " | TOPICAL_OINTMENT | Freq: Two times a day (BID) | CUTANEOUS | Status: DC
  Administered 2014-09-27 (×2): 1 via NASAL
  Filled 2014-09-27: qty 22

## 2014-09-27 MED ORDER — SODIUM CHLORIDE 0.9 % IV SOLN
INTRAVENOUS | Status: DC
  Administered 2014-09-27 (×2): via INTRAVENOUS

## 2014-09-27 MED ORDER — CHLORHEXIDINE GLUCONATE CLOTH 2 % EX PADS
6.0000 | MEDICATED_PAD | Freq: Every day | CUTANEOUS | Status: DC
  Administered 2014-09-27 – 2014-09-28 (×2): 6 via TOPICAL

## 2014-09-27 MED ORDER — DOCUSATE SODIUM 100 MG PO CAPS: 100.0000 mg | ORAL_CAPSULE | Freq: Every day | ORAL | Status: DC

## 2014-09-27 MED ORDER — METOPROLOL TARTRATE 25 MG PO TABS
25.0000 mg | ORAL_TABLET | Freq: Two times a day (BID) | ORAL | Status: DC
  Administered 2014-09-27 – 2014-09-28 (×4): 25 mg via ORAL
  Filled 2014-09-27 (×4): qty 1

## 2014-09-27 MED ORDER — DOCUSATE SODIUM 100 MG PO CAPS
100.0000 mg | ORAL_CAPSULE | Freq: Two times a day (BID) | ORAL | Status: DC
  Administered 2014-09-27 – 2014-09-28 (×4): 100 mg via ORAL
  Filled 2014-09-27 (×4): qty 1

## 2014-09-27 MED ORDER — ACETAMINOPHEN 325 MG PO TABS: 650.0000 mg | ORAL_TABLET | Freq: Four times a day (QID) | ORAL | Status: DC | PRN

## 2014-09-27 MED ORDER — VITAMIN B-1 100 MG PO TABS
100.0000 mg | ORAL_TABLET | Freq: Every day | ORAL | Status: DC
  Administered 2014-09-27 – 2014-09-28 (×2): 100 mg via ORAL
  Filled 2014-09-27 (×2): qty 1

## 2014-09-27 MED ORDER — HEPARIN SODIUM (PORCINE) 5000 UNIT/ML IJ SOLN
5000.0000 [IU] | Freq: Three times a day (TID) | INTRAMUSCULAR | Status: DC
  Administered 2014-09-27 – 2014-09-28 (×4): 5000 [IU] via SUBCUTANEOUS
  Filled 2014-09-27 (×4): qty 1

## 2014-09-27 MED ORDER — ATORVASTATIN CALCIUM 20 MG PO TABS
20.0000 mg | ORAL_TABLET | Freq: Every evening | ORAL | Status: DC
  Administered 2014-09-27: 20 mg via ORAL
  Filled 2014-09-27: qty 1

## 2014-09-27 MED ORDER — ONDANSETRON HCL 4 MG/2ML IJ SOLN: 4.0000 mg | Freq: Four times a day (QID) | INTRAMUSCULAR | Status: DC | PRN

## 2014-09-27 MED ORDER — ADULT MULTIVITAMIN W/MINERALS CH
1.0000 | ORAL_TABLET | Freq: Every day | ORAL | Status: DC
  Administered 2014-09-27 – 2014-09-28 (×2): 1 via ORAL
  Filled 2014-09-27 (×2): qty 1

## 2014-09-27 MED ORDER — GABAPENTIN 300 MG PO CAPS
300.0000 mg | ORAL_CAPSULE | Freq: Two times a day (BID) | ORAL | Status: DC
  Administered 2014-09-27 – 2014-09-28 (×3): 300 mg via ORAL
  Filled 2014-09-27 (×3): qty 1

## 2014-09-27 MED ORDER — PANTOPRAZOLE SODIUM 40 MG PO TBEC
40.0000 mg | DELAYED_RELEASE_TABLET | Freq: Every day | ORAL | Status: DC
  Administered 2014-09-28: 40 mg via ORAL
  Filled 2014-09-27: qty 1

## 2014-09-27 MED ORDER — ASPIRIN EC 81 MG PO TBEC
81.0000 mg | DELAYED_RELEASE_TABLET | Freq: Every day | ORAL | Status: DC
  Administered 2014-09-27 – 2014-09-28 (×2): 81 mg via ORAL
  Filled 2014-09-27 (×2): qty 1

## 2014-09-27 MED ORDER — ALBUTEROL SULFATE (2.5 MG/3ML) 0.083% IN NEBU: 2.5000 mg | INHALATION_SOLUTION | Freq: Four times a day (QID) | RESPIRATORY_TRACT | Status: DC | PRN

## 2014-09-27 MED ORDER — FOLIC ACID 1 MG PO TABS
1.0000 mg | ORAL_TABLET | Freq: Every day | ORAL | Status: DC
  Administered 2014-09-27 – 2014-09-28 (×2): 1 mg via ORAL
  Filled 2014-09-27 (×2): qty 1

## 2014-09-27 MED ORDER — CALCIUM CARBONATE ANTACID 500 MG PO CHEW
1.0000 | CHEWABLE_TABLET | Freq: Every day | ORAL | Status: DC
Start: 2014-09-27 — End: 2014-09-28
  Administered 2014-09-27: 400 mg via ORAL
  Administered 2014-09-28: 200 mg via ORAL
  Filled 2014-09-27: qty 2
  Filled 2014-09-27: qty 1

## 2014-09-27 NOTE — Progress Notes (Signed)
Occupational Therapy Evaluation Patient Details Name: Anne Armstrong MRN: [id number] DOB: 10-26-1923 Today's Date: 09/27/2014    History of Present Illness 81 y.o. female with atrial fibrillation HTN hyperlipidemia presents with unresponsiveness and a UTI   Clinical Impression   Pt admitted with the above diagnoses and presents with below problem list. Pt will benefit from continued OT to address the below listed deficits and maximize independence with BADLs prior to return to venue below. Pt presents with generalized weakness impacting current level of assist with ADLs. OT to continue to follow acutely towards the goal of restoring baseline ADL level of assist. Session limited by fatigue. ADLs completed as detailed below.      Follow Up Recommendations  Supervision/Assistance - 24 hour;Home health OT;Other (comment) (Return to ALF [facility] with Assist and HHOT)    Equipment Recommendations  None recommended by OT    Recommendations for Other Services       Precautions / Restrictions Precautions Precautions: Fall Restrictions Weight Bearing Restrictions: No      Mobility Bed Mobility Overal bed mobility: Needs Assistance Bed Mobility: Supine to Sit Rolling: Min assist Sidelying to sit: Min assist Supine to sit: HOB elevated;Max assist Sit to supine: Min assist   General bed mobility comments: Assist to powerup trunk and manage positioning og BLE. Pt able to advance BLE minimally during transfer. Fatigued quickly.  Transfers Overall transfer level: Needs assistance Equipment used: Rolling walker (2 wheeled) Transfers: Squat Pivot Transfers Sit to Stand: Mod assist;+2 physical assistance   Squat pivot transfers: Max assist;+2 physical assistance     General transfer comment: squat-pivot transfer x2 from EOB to [facility] and then from [facility] to recliner. Pt fatigued quickly provided 2 rest breaks.     Balance Overall balance assessment: Needs assistance Sitting-balance  support: Bilateral upper extremity supported;Feet supported Sitting balance-Leahy Scale: Poor Sitting balance - Comments: Pt fatigued quickly EOB with noted trunk flexion.    Standing balance support: Bilateral upper extremity supported;During functional activity Standing balance-Leahy Scale: Poor Standing balance comment: needs assist to stand                            ADL Overall ADL's : Needs assistance/impaired Eating/Feeding: Set up;Sitting   Grooming: Set up;Sitting;Minimal assistance   Upper Body Bathing: Sitting;Maximal assistance   Lower Body Bathing: Maximal assistance;Sit to/from stand;Sitting/lateral leans;+2 for physical assistance   Upper Body Dressing : Sitting;Moderate assistance   Lower Body Dressing: Maximal assistance;+2 for physical assistance;Sit to/from stand   Toilet Transfer: Maximal assistance;+2 for physical assistance;Squat-pivot;BSC Toilet Transfer Details (indicate cue type and reason): pt wears incontinence supplies at home, does not do toilet transfers [place] and Hygiene: Maximal assistance;+2 for physical assistance;Sit to/from stand Toileting - Clothing Manipulation Details (indicate cue type and reason): assist to stand and to complete perianal hygiene   Tub/Shower Transfer Details (indicate cue type and reason): did not do at baseline   General ADL Comments: Pt completed bed mobility and squat pivot transfer two times with +2 max physical A. Pt noted to fatigue quickly and needed rest breaks after each transfer. At baseline pt was +1 min-max assist with ADLs.      Vision     Perception     Praxis      Pertinent Vitals/Pain Pain Assessment: No/denies pain     Hand Dominance Right   Extremity/Trunk Assessment Upper Extremity Assessment Upper Extremity Assessment: Generalized weakness   Lower Extremity Assessment Lower Extremity  Assessment: Defer to PT evaluation;Generalized weakness   Cervical  / Trunk Assessment Cervical / Trunk Assessment: Kyphotic   Communication Communication Communication: No difficulties   Cognition Arousal/Alertness: Awake/alert Behavior During Therapy: WFL for tasks assessed/performed Overall Cognitive Status: Within Functional Limits for tasks assessed (patient alert and oriented today)                     General Comments       Exercises       Shoulder Instructions      Home Living Family/patient expects to be discharged to:: Private residence Living Arrangements: Spouse/significant other Available Help at Discharge: Family;Personal care attendant;Other (Comment) (aide assists with ADLs/transfers ) Type of Home: Independent living facility             Bathroom Shower/Tub: Other (comment)         Home Equipment: Bedside commode;Other (comment) (lift equipment? pt reports device that helps her stand)   Additional Comments: Lives in [place] with spouse with intermittent assist from aide for ADLs/transfer.      Prior Functioning/Environment Level of Independence: Needs assistance  Gait / Transfers Assistance Needed: per pt did not ambulate, transfers only ADL's / Homemaking Assistance Needed: per pt aide provides mod-max A with bathing; min A UB dressing, max A for LB dressing. Pt does not do toilet transfers at home, uses incontinent supplies for bladder/bowel functions.        OT Diagnosis: Generalized weakness   OT Problem List: Decreased strength;Decreased activity tolerance;Impaired balance (sitting and/or standing);Decreased knowledge of use of DME or AE   OT Treatment/Interventions: Self-care/ADL training;Therapeutic exercise;Energy conservation;DME and/or AE instruction;Therapeutic activities;Patient/family education;Balance training    OT Goals(Current goals can be found in the care plan section) Acute Rehab OT Goals Patient Stated Goal: to go back to ALF OT Goal Formulation: With patient Time For Goal Achievement:  10/11/14 Potential to Achieve Goals: Good ADL Goals Pt/caregiver will Perform Home Exercise Program: Increased strength;Both right and left upper extremity;With theraband;With written HEP provided Additional ADL Goal #1: Pt will complete grooming tasks in sitting position at supervision level.   OT Frequency: Min 2X/week   Barriers to D/C:            Co-evaluation              End of Session Equipment Utilized During Treatment: Gait belt Nurse Communication: Other (comment) (Nurse tech assisted with transfers.)  Activity Tolerance: Patient limited by fatigue;Patient tolerated treatment well Patient left: in chair;with call bell/phone within reach;with chair alarm set   Time: 9417-4081 OT Time Calculation (min): 33 min Charges:  OT General Charges $OT Visit: 1 Procedure OT Evaluation $Initial OT Evaluation Tier I: 1 Procedure OT Treatments $Self Care/Home Management : 8-22 mins G-Codes: OT G-codes **NOT FOR INPATIENT CLASS** Functional Assessment Tool Used: clincal judgement Functional Limitation: Self care Self Care Current Status (K4818): At least 60 percent but less than 80 percent impaired, limited or restricted Self Care Goal Status (H6314): At least 40 percent but less than 60 percent impaired, limited or restricted  Hortencia Pilar 09/27/2014, 3:05 PM

## 2014-09-27 NOTE — H&P (Signed)
[facility] History and Physical  Anne Armstrong EHM:[id number] DOB: [birth year] DOA: 09/26/2014  Referring physician: Noemi Chapel, MD PCP: Pcp Not In System   Chief Complaint: UTI  HPI: Anne Armstrong is a 81 y.o. female with atrial fibrillation HTN hyperlipidemia presents with unresponsiveness and a UTI. She was not responding all day today according to the daughter. She has a care giver that noted this. She would wake enough to say yes but then pass out. The family called EMS and when they arrived she became more awake and alert. In the ED she was found to have a UTI. The daughter is concerned that she had a bout of endocarditis which started as a UTI and she wants to get her started on IV antibiotics. She apparently has also been having shoulder pain in the left shoulder. This has kept her up all night until 2 in the morning. Patient did not sleep well as a result. Patient fell asleep after that. She has been worked up in the past for this shoulder pain and has been found to be not cardiac origin. Currently the patient is awake and alert. Her creatinine is elevated but her CT was negative and she also has not been eating well for the last day or so.   Review of Systems:  Complete ROS performed and is unremarkable other than noted in the HPI  Past Medical History  Diagnosis Date  . Gastro-esophageal reflux   . Arthralgia   . A-fib   . Osteoarthritis   . Pneumonia   . UTI (urinary tract infection)   . NSTEMI (non-ST elevated myocardial infarction) 05/26/14  . Heart murmur   . Stroke 05/26/14    Septic emboli to cerebral and cerebellar hemispheres  . SVT (supraventricular tachycardia)   . 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 CAD (coronary artery disease) 05/26/14  . Dysphagia 05/26/14  . Hypertension   . History of fall   . Basal cell carcinoma of face   . Debility    History reviewed. No pertinent past surgical  history. Social History:  reports that she has never smoked. She has never used smokeless tobacco. She reports that she does not drink alcohol or use illicit drugs.  Allergies  Allergen Reactions  . Other Other (See Comments)    BEEF AND PORK PRODUCTS CAUSED SEVERE ESOPHAGEAL ISSUES AND SEVERE PAINS IN LEFT CHEST AND NECK AREA  Patient has taken Heparin in past, that is ok.   . Iodine     Unknown reaction per MAR   . Macrobid [Nitrofurantoin]     Unknown reaction per MAR   . Novocain [Procaine]     Unknown reaction per MAR   . Penicillins     Unknown reaction per MAR   . Shellfish Allergy     Unknown reaction per MAR   . Sulfa Antibiotics     Unknown reaction per [facility]     History reviewed. No pertinent family history.   Prior to Admission medications   Medication Sig Start Date End Date Taking? Authorizing Provider  aspirin EC 81 MG tablet Take 81 mg by mouth daily.   Yes Historical Provider, MD  atorvastatin (LIPITOR) 20 MG tablet Take 20 mg by mouth every evening.   Yes Historical Provider, MD  calcium carbonate (TUMS - DOSED IN MG ELEMENTAL CALCIUM) 500 MG chewable tablet Chew 1-2 tablets by mouth daily.   Yes Historical Provider, MD  Calcium Carbonate-Vitamin D (CALTRATE  600+D PO) Take 1 tablet by mouth daily.   Yes Historical Provider, MD  docusate sodium (COLACE) 100 MG capsule Take 100 mg by mouth daily.   Yes Historical Provider, MD  gabapentin (NEURONTIN) 300 MG capsule Take 300 mg by mouth 2 (two) times daily.    Yes Historical Provider, MD  hydrALAZINE (APRESOLINE) 25 MG tablet Take 25 mg by mouth 2 (two) times daily.    Yes Historical Provider, MD  metoprolol tartrate (LOPRESSOR) 25 MG tablet Take 25 mg by mouth 2 (two) times daily.  06/26/12  Yes Historical Provider, MD  omeprazole (PRILOSEC) 40 MG capsule Take 1 capsule (40 mg total) by mouth daily before breakfast. Patient taking differently: Take 20 mg by mouth 2 (two) times daily.  07/05/14  Yes Shanker Kristeen Mans, MD   promethazine (PHENERGAN) 25 MG tablet Take 25 mg by mouth every 6 (six) hours as needed for nausea or vomiting.   Yes Historical Provider, MD  traMADol (ULTRAM) 50 MG tablet Take one tablet by mouth twice daily for pain; Take one tablet by mouth four times daily as needed for moderate pain 07/14/14  Yes Gildardo Cranker, DO  cefTRIAXone 2 g in dextrose 5 % 50 mL Inject 2 g into the vein daily. For 4 weeks-Please finish course as outlined by her Physicians in [facility] 07/05/14   Jonetta Osgood, MD  HYDROcodone-acetaminophen (NORCO/VICODIN) 5-325 MG per tablet Take 1 tablet by mouth every 6 (six) hours as needed. 08/30/14   Merryl Hacker, MD  meclizine (ANTIVERT) 12.5 MG tablet Take 1 tablet (12.5 mg total) by mouth 3 (three) times daily as needed for dizziness or nausea. 06/14/13   John Molpus, MD  PROAIR HFA 108 (90 BASE) MCG/ACT inhaler Inhale 1 puff into the lungs every 6 (six) hours as needed for wheezing or shortness of breath.  03/09/13   Historical Provider, MD   Physical Exam: Filed Vitals:   09/26/14 2100 09/26/14 2115 09/26/14 2130 09/27/14 0015  BP: 141/60 146/56 151/52 169/68  Pulse: 63 63 64 71  Temp:      TempSrc:      Resp: 21 23 22 21   SpO2: 98% 100% 95% 100%    Wt Readings from Last 3 Encounters:  08/24/14 54.522 kg (120 lb 3.2 oz)  07/06/14 54.613 kg (120 lb 6.4 oz)  06/16/14 57.607 kg (127 lb)    General:  Appears calm and comfortable Eyes: PERRL, normal lids, irises & conjunctiva ENT: grossly normal hearing, lips & tongue Neck: no LAD, masses or thyromegaly Cardiovascular: RRR, no m/r/g. No LE edema. Respiratory: CTA bilaterally, no w/r/r. Normal respiratory effort. Abdomen: soft, ntnd Skin: no rash or induration seen on limited exam Musculoskeletal: grossly normal tone BUE/BLE Psychiatric: grossly normal mood and affect Neurologic: grossly non-focal. Awake and alert          Labs on Admission:  Basic Metabolic Panel:  Recent Labs Lab  09/26/14 2107 09/26/14 2127  NA 141 141  K 4.0 4.0  CL 105 103  CO2 26  --   GLUCOSE 109* 108*  BUN 30* 30*  CREATININE 1.25* 1.30*  CALCIUM 10.7*  --    Liver Function Tests:  Recent Labs Lab 09/26/14 2107  AST 19  ALT 14  ALKPHOS 88  BILITOT 0.7  PROT 5.7*  ALBUMIN 3.0*   No results for input(s): LIPASE, AMYLASE in the last 168 hours. No results for input(s): AMMONIA in the last 168 hours. CBC:  Recent Labs Lab  09/26/14 2107 09/26/14 2127  WBC 9.2  --   NEUTROABS 5.4  --   HGB 10.4* 10.9*  HCT 31.8* 32.0*  MCV 80.3  --   PLT 246  --    Cardiac Enzymes: No results for input(s): CKTOTAL, CKMB, CKMBINDEX, TROPONINI in the last 168 hours.  BNP (last 3 results) No results for input(s): BNP in the last 8760 hours.  ProBNP (last 3 results) No results for input(s): PROBNP in the last 8760 hours.  CBG:  Recent Labs Lab 09/26/14 2045  GLUCAP 99    Radiological Exams on Admission: Ct Head Wo Contrast  09/26/2014   CLINICAL DATA:  81 year old female with altered mental status.  EXAM: CT HEAD WITHOUT CONTRAST  TECHNIQUE: Contiguous axial images were obtained from the base of the skull through the vertex without intravenous contrast.  COMPARISON:  07/02/2014 and prior exams  FINDINGS: Moderate atrophy and chronic small-vessel white matter ischemic changes again noted. Remote basal ganglia and cerebellar infarcts are again noted.  No acute intracranial abnormalities are identified, including mass lesion or mass effect, hydrocephalus, extra-axial fluid collection, midline shift, hemorrhage, or acute infarction.  The visualized bony calvarium is unremarkable.  IMPRESSION: No evidence of acute intracranial abnormality.  Atrophy, chronic small-vessel white matter ischemic changes and remote basal ganglia and cerebellar infarcts.   Electronically Signed   By: Margarette Canada M.D.   On: 09/26/2014 22:02   Dg Chest Port 1 View  09/26/2014   CLINICAL DATA:  81 year old female with  altered mental status. Found unresponsive.  EXAM: PORTABLE CHEST - 1 VIEW  COMPARISON:  07/02/2014  FINDINGS: Cardiomegaly is unchanged allowing for differences in technique. There is improved left basilar aeration with minimal residual atelectasis. Mild atelectasis or scarring at the right lung base. No new consolidation. The right-sided central line has been removed. No pulmonary edema, pleural effusion, or pneumothorax. No acute osseous abnormalities are seen.  IMPRESSION: Stable cardiomegaly. Improved left base aeration. Stable scarring or atelectasis at the right lung base.   Electronically Signed   By: Jeb Levering M.D.   On: 09/26/2014 21:37      Assessment/Plan Active Problems:   Hypertension   A-fib   UTI (lower urinary tract infection)   AKI (acute kidney injury)   Hyperlipidemia   1. UTI -started on Rocephin 1gm daily -will keep hydrated IVF started in ED -await cultures results  2. HTN -will continue with antihypertensives -monitor pressures  3. Hyperlipidemia -will continue with statins -check lipid profile  4. AKI -due to poor po intake -will hydrate with IVF monitor labs  5. Atrial Fibrillation -now appears to be in sinus rhythm  6. Deconditioning -will get PT/OT to evaluate     Code Status: Full Code (must indicate code status--if unknown or must be presumed, indicate so) DVT Prophylaxis:Heparin Family Communication: daughter (indicate person spoken with, if applicable, with phone number if by telephone) Disposition Plan: SNF (indicate anticipated LOS)  Time spent: 75min  KHAN,SAADAT A [facility] Pager [phone]

## 2014-09-27 NOTE — Evaluation (Signed)
Physical Therapy Evaluation Patient Details Name: Anne Armstrong MRN: [id number] DOB: 1923/08/13 Today's Date: 09/27/2014   History of Present Illness  81 y.o. female with atrial fibrillation HTN hyperlipidemia presents with unresponsiveness and a UTI  Clinical Impression  Patient demonstrates deficits in functional mobility as indicated below. Will benefit from continued skilled PT to address deficits and maximize function. Will see as indicated and progress as tolerated. Recommend patient return to ALF with assist and continue with HHPT therapies as prior to admission.     Follow Up Recommendations Home health PT;Supervision for mobility/OOB (rec Return to ALF [facility] with Assist and HHPT)    Equipment Recommendations  None recommended by PT    Recommendations for Other Services       Precautions / Restrictions Precautions Precautions: Fall Restrictions Weight Bearing Restrictions: No      Mobility  Bed Mobility Overal bed mobility: Needs Assistance Bed Mobility: Rolling;Sidelying to Sit;Sit to Supine Rolling: Min assist Sidelying to sit: Min assist   Sit to supine: Min assist   General bed mobility comments: assist to position for rolling, assist to elevate trunk to EOB and assit for return to supine and reposition. patient was able to initate LE movement but limited by fatigue. Increased time to perform  Transfers Overall transfer level: Needs assistance Equipment used: Rolling walker (2 wheeled) Transfers: Sit to/from Stand Sit to Stand: Mod assist;+2 safety/equipment         General transfer comment: VCs for hand placement, assist to power up to standing  Ambulation/Gait             General Gait Details: non ambualtory at baseline (used w/c)  Science writer    Modified Rankin (Stroke Patients Only)       Balance Overall balance assessment: Needs assistance Sitting-balance support: Feet supported Sitting  balance-Leahy Scale: Fair     Standing balance support: Bilateral upper extremity supported;During functional activity Standing balance-Leahy Scale: Poor Standing balance comment: patient with LOB in static standing to the right and posteriorly                             Pertinent Vitals/Pain Pain Assessment: No/denies pain    Home Living Family/patient expects to be discharged to:: Private residence Living Arrangements: Spouse/significant other Available Help at Discharge: Family;Personal care attendant;Other (Comment) (aide assists with ADLs/transfers ) Type of Home: Independent living facility         Home Equipment: Bedside commode;Other (comment) (lift equipment? pt reports device that helps her stand) Additional Comments: Lives in [place] with spouse with intermittent assist from aide for ADLs/transfer.    Prior Function Level of Independence: Needs assistance   Gait / Transfers Assistance Needed: per pt did not ambulate, transfers only  ADL's / Homemaking Assistance Needed: per pt aide provides mod-max A with bathing; min A UB dressing, max A for LB dressing. Pt does not do toilet transfers at home, uses incontinent supplies for bladder/bowel functions.        Hand Dominance   Dominant Hand: Right    Extremity/Trunk Assessment   Upper Extremity Assessment: Generalized weakness           Lower Extremity Assessment: Generalized weakness      Cervical / Trunk Assessment: Kyphotic  Communication   Communication: No difficulties  Cognition Arousal/Alertness: Awake/alert Behavior During Therapy: WFL for tasks assessed/performed Overall Cognitive Status: Within Functional Limits  for tasks assessed (patient alert and oriented today)                      General Comments      Exercises        Assessment/Plan    PT Assessment Patient needs continued PT services  PT Diagnosis Difficulty walking;Generalized weakness   PT Problem List  Decreased strength;Decreased range of motion;Decreased activity tolerance;Decreased balance;Decreased mobility;Pain  PT Treatment Interventions DME instruction;Gait training;Functional mobility training;Therapeutic activities;Therapeutic exercise;Balance training;Patient/family education   PT Goals (Current goals can be found in the Care Plan section) Acute Rehab PT Goals Patient Stated Goal: to go back to ALF PT Goal Formulation: With patient Time For Goal Achievement: 10/11/14 Potential to Achieve Goals: Good    Frequency Min 3X/week   Barriers to discharge        Co-evaluation               End of Session Equipment Utilized During Treatment: Gait belt Activity Tolerance: Patient tolerated treatment well;Patient limited by fatigue Patient left: in bed;with call bell/phone within reach;with bed alarm set Nurse Communication: Mobility status    Functional Assessment Tool Used: clinical judgement Functional Limitation: Mobility: Walking and moving around Mobility: Walking and Moving Around Current Status (Y3338): At least 40 percent but less than 60 percent impaired, limited or restricted Mobility: Walking and Moving Around Goal Status [phone]): At least 20 percent but less than 40 percent impaired, limited or restricted    Time: 1660-6004 PT Time Calculation (min) (ACUTE ONLY): 14 min   Charges:   PT Evaluation $Initial PT Evaluation Tier I: 1 Procedure     PT G Codes:   PT G-Codes **NOT FOR INPATIENT CLASS** Functional Assessment Tool Used: clinical judgement Functional Limitation: Mobility: Walking and moving around Mobility: Walking and Moving Around Current Status (H9977): At least 40 percent but less than 60 percent impaired, limited or restricted Mobility: Walking and Moving Around Goal Status [phone]): At least 20 percent but less than 40 percent impaired, limited or restricted    Duncan Dull 09/27/2014, 2:43 PM Alben Deeds, [place] DPT  [phone]

## 2014-09-27 NOTE — Progress Notes (Addendum)
Received patient from ED. Patient AOx4, VS stable with slightly elevated BP, and no complain of pain.  Oriented to room and call light.  Gave patient graham crackers and ensure to eat and drink.  Patient resting comfortably in bed as of this writing.

## 2014-09-27 NOTE — Progress Notes (Signed)
Pt placed on telemetry. Anne Armstrong

## 2014-09-27 NOTE — Progress Notes (Addendum)
[facility] Progress Note   Labria Wos LGX:[id number] DOB: 03/14/1924 DOA: 09/26/2014 PCP: Pcp Not In System  Brief narrative: Kasey Hansell is a 81 y.o. female past medical history of atrial fibrillation, hypertension, hyperlipidemia, recent strep viridans endocarditis and health care acquired pneumonia versus aspiration pneumonia. The patient is awake and alert at this time and able to give a history but she was sent to the hospital by her family when she was difficult to arouse yesterday. The patient states that she had a very bad night and was not able to sleep until after 2 AM. When family was not able to arouse her the next day, EMS was called. When EMS was about to intubate her, the patient woke up and was alert and oriented 3. When evaluated in the ER, she continued to be well oriented but due to family's concerns, she was kept overnight for further observation. Head CT was negative. The patient tells me today that she was simply sleeping. He is also noted to have mild acute renal failure IV fluids were started   Subjective: Awake and alert this morning. She has no complaints of focal numbness weakness shortness of breath or chest pain. No complaints of dysuria, hematuria, pyuria, suprapubic pain, lower back pain, fevers or chills.  Assessment/Plan: Principal Problem:   Unresponsive episode - CT head negative-no other neurological symptoms-continue to follow on tele  Active Problems: Acute renal failure/dehydration -Elevated BUN/creatinine ratio -Continue to hydrate-baseline creatinine is less than 1 -Recheck metabolic panel tomorrow morning  Urinary tract infection? -Despite having a positive UA, she is asymptomatic and therefore I will stop Rocephin-received ciprofloxacin in the ER  Fecal impaction -Hard stool noticed by nursing staff was unable to manually disimpact her-enema ordered with resultant large bowel movement    Hypertension -Continue metoprolol     A-fib -Continue metoprolol and baby aspirin daily   Code Status: Full code Family Communication:  Disposition Plan: Home tomorrow if renal function improves DVT prophylaxis: Heparin Consultants: Procedures:  Antibiotics: Anti-infectives    Start     Dose/Rate Route Frequency Ordered Stop   09/27/14 0200  cefTRIAXone (ROCEPHIN) 1 g in dextrose 5 % 50 mL IVPB - Premix     1 g 100 mL/hr over 30 Minutes Intravenous Daily at bedtime 09/27/14 0137     09/26/14 2345  ciprofloxacin (CIPRO) IVPB 400 mg     400 mg 200 mL/hr over 60 Minutes Intravenous  Once 09/26/14 2340 09/27/14 0116      Objective: Filed Weights   09/27/14 0129  Weight: 57.743 kg (127 lb 4.8 oz)    Intake/Output Summary (Last 24 hours) at 09/27/14 1213 Last data filed at 09/27/14 0535  Gross per 24 hour  Intake    380 ml  Output    100 ml  Net    280 ml     Vitals Filed Vitals:   09/27/14 0100 09/27/14 0129 09/27/14 0535 09/27/14 1013  BP: 176/65 171/62 120/47 140/63  Pulse: 70 68 77 79  Temp:  97.7 F (36.5 C) 97.5 F (36.4 C)   TempSrc:  Oral Oral   Resp: 23 20 18    Height:  5' (1.524 m)    Weight:  57.743 kg (127 lb 4.8 oz)    SpO2: 100% 100% 97%     Exam:  General:  Pt is alert and oriented 3, not in acute distress  HEENT: No icterus, No thrush, oral mucosa moist  Cardiovascular: regular rate and rhythm, S1/S2 No murmur  Respiratory:  clear to auscultation bilaterally   Abdomen: Soft, +Bowel sounds, non tender, non distended, no guarding  MSK: No LE edema, cyanosis or clubbing  Data Reviewed: Basic Metabolic Panel:  Recent Labs Lab 09/26/14 2107 09/26/14 2127 09/27/14 0258  NA 141 141 139  K 4.0 4.0 4.2  CL 105 103 103  CO2 26  --  22  GLUCOSE 109* 108* 143*  BUN 30* 30* 29*  CREATININE 1.25* 1.30* 1.26*  1.26*  CALCIUM 10.7*  --  10.6*   Liver Function Tests:  Recent Labs Lab 09/26/14 2107 09/27/14 0258  AST 19 39  ALT 14 15  ALKPHOS 88 103  BILITOT 0.7 0.9   PROT 5.7* 6.7  ALBUMIN 3.0* 3.2*   No results for input(s): LIPASE, AMYLASE in the last 168 hours. No results for input(s): AMMONIA in the last 168 hours. CBC:  Recent Labs Lab 09/26/14 2107 09/26/14 2127 09/27/14 0258  WBC 9.2  --  10.0  NEUTROABS 5.4  --   --   HGB 10.4* 10.9* 12.0  HCT 31.8* 32.0* 36.4  MCV 80.3  --  81.1  PLT 246  --  243   Cardiac Enzymes: No results for input(s): CKTOTAL, CKMB, CKMBINDEX, TROPONINI in the last 168 hours. BNP (last 3 results) No results for input(s): BNP in the last 8760 hours.  ProBNP (last 3 results) No results for input(s): PROBNP in the last 8760 hours.  CBG:  Recent Labs Lab 09/26/14 2045 09/27/14 0724  GLUCAP 99 110*    Recent Results (from the past 240 hour(s))  MRSA PCR Screening     Status: Abnormal   Collection Time: 09/27/14  2:31 AM  Result Value Ref Range Status   MRSA by PCR POSITIVE (A) NEGATIVE Final    Comment:        The GeneXpert MRSA Assay (FDA approved for NASAL specimens only), is one component of a comprehensive MRSA colonization surveillance program. It is not intended to diagnose MRSA infection nor to guide or monitor treatment for MRSA infections. RESULT CALLED TO, READ BACK BY AND VERIFIED WITH: CELSO,S RN 0454 09/27/14 MITCHELL,L      Studies: Ct Head Wo Contrast  09/26/2014   CLINICAL DATA:  81 year old female with altered mental status.  EXAM: CT HEAD WITHOUT CONTRAST  TECHNIQUE: Contiguous axial images were obtained from the base of the skull through the vertex without intravenous contrast.  COMPARISON:  07/02/2014 and prior exams  FINDINGS: Moderate atrophy and chronic small-vessel white matter ischemic changes again noted. Remote basal ganglia and cerebellar infarcts are again noted.  No acute intracranial abnormalities are identified, including mass lesion or mass effect, hydrocephalus, extra-axial fluid collection, midline shift, hemorrhage, or acute infarction.  The visualized bony  calvarium is unremarkable.  IMPRESSION: No evidence of acute intracranial abnormality.  Atrophy, chronic small-vessel white matter ischemic changes and remote basal ganglia and cerebellar infarcts.   Electronically Signed   By: Margarette Canada M.D.   On: 09/26/2014 22:02   Dg Chest Port 1 View  09/26/2014   CLINICAL DATA:  81 year old female with altered mental status. Found unresponsive.  EXAM: PORTABLE CHEST - 1 VIEW  COMPARISON:  07/02/2014  FINDINGS: Cardiomegaly is unchanged allowing for differences in technique. There is improved left basilar aeration with minimal residual atelectasis. Mild atelectasis or scarring at the right lung base. No new consolidation. The right-sided central line has been removed. No pulmonary edema, pleural effusion, or pneumothorax. No acute osseous abnormalities are seen.  IMPRESSION: Stable cardiomegaly. Improved  left base aeration. Stable scarring or atelectasis at the right lung base.   Electronically Signed   By: Jeb Levering M.D.   On: 09/26/2014 21:37    Scheduled Meds:  Scheduled Meds: . aspirin EC  81 mg Oral Daily  . atorvastatin  20 mg Oral QPM  . calcium carbonate  1-2 tablet Oral Daily  . cefTRIAXone (ROCEPHIN)  IV  1 g Intravenous QHS  . Chlorhexidine Gluconate Cloth  6 each Topical Q0600  . docusate sodium  100 mg Oral BID  . folic acid  1 mg Oral Daily  . gabapentin  300 mg Oral BID  . heparin  5,000 Units Subcutaneous 3 times per day  . hydrALAZINE  25 mg Oral BID  . metoprolol tartrate  25 mg Oral BID  . multivitamin with minerals  1 tablet Oral Daily  . mupirocin ointment  1 application Nasal BID  . pantoprazole  80 mg Oral Daily  . thiamine  100 mg Oral Daily   Continuous Infusions: . sodium chloride 50 mL/hr at 09/27/14 0142    Time spent on care of this patient: 22 minutes   Rosalia, MD 09/27/2014, 12:13 PM    [facility] Office  [phone] Pager - Text Page per [email or web address] If 7PM-7AM, please contact  night-coverage [email or web address]

## 2014-09-27 NOTE — Progress Notes (Signed)
PT Cancellation Note  Patient Details Name: Anne Armstrong MRN: [id number] DOB: [birth year]   Cancelled Treatment:    Reason Eval/Treat Not Completed: Patient not medically ready, bedrest active   Duncan Dull 09/27/2014, 11:41 AM

## 2014-09-27 NOTE — Progress Notes (Signed)
Enema successful, pt had a large BM. Graceann Congress

## 2014-09-27 NOTE — Progress Notes (Signed)
OT Cancellation Note  Patient Details Name: Azaliah Carrero MRN: [id number] DOB: 1923/08/02   Cancelled Treatment:    Reason Eval/Treat Not Completed: Other (comment) (Patient has active bed rest order.) Acute OT to reattempt evaluation when activity order increased.   Hortencia Pilar 09/27/2014, 8:58 AM

## 2014-09-28 DIAGNOSIS — I10 Essential (primary) hypertension: Secondary | ICD-10-CM | POA: Diagnosis not present

## 2014-09-28 DIAGNOSIS — N179 Acute kidney failure, unspecified: Secondary | ICD-10-CM | POA: Diagnosis not present

## 2014-09-28 DIAGNOSIS — R404 Transient alteration of awareness: Secondary | ICD-10-CM | POA: Diagnosis not present

## 2014-09-28 DIAGNOSIS — R4182 Altered mental status, unspecified: Secondary | ICD-10-CM | POA: Diagnosis not present

## 2014-09-28 DIAGNOSIS — I4891 Unspecified atrial fibrillation: Secondary | ICD-10-CM | POA: Diagnosis not present

## 2014-09-28 LAB — HEMOGLOBIN A1C
Hgb A1c MFr Bld: 6 % — ABNORMAL HIGH (ref 4.8–5.6)
Mean Plasma Glucose: 126 mg/dL

## 2014-09-28 LAB — BASIC METABOLIC PANEL
ANION GAP: 9 (ref 5–15)
BUN: 24 mg/dL — AB (ref 6–20)
CO2: 24 mmol/L (ref 22–32)
Calcium: 9 mg/dL (ref 8.9–10.3)
Chloride: 103 mmol/L (ref 101–111)
Creatinine, Ser: 1.12 mg/dL — ABNORMAL HIGH (ref 0.44–1.00)
GFR calc Af Amer: 49 mL/min — ABNORMAL LOW (ref >60)
GFR, EST NON AFRICAN AMERICAN: 42 mL/min — AB (ref >60)
Glucose, Bld: 90 mg/dL (ref 65–99)
POTASSIUM: 3.6 mmol/L (ref 3.5–5.1)
SODIUM: 136 mmol/L (ref 135–145)

## 2014-09-28 LAB — GLUCOSE, CAPILLARY: Glucose-Capillary: 87 mg/dL (ref 65–99)

## 2014-09-28 MED ORDER — LEVOFLOXACIN 250 MG PO TABS: 250.0000 mg | ORAL_TABLET | Freq: Every day | ORAL | Status: AC

## 2014-09-28 NOTE — Progress Notes (Signed)
Discharge paperwork given to patient. Reviewed with patient and daughter. Questions answered. Patient on stretcher ready for discharge.

## 2014-09-28 NOTE — Care Management Note (Addendum)
Case Management Note  Patient Details  Name: Anne Armstrong MRN: [id number] Date of Birth: 09/12/1923  Subjective/Objective:                    Action/Plan: Patient discharging to [facility] [phone] , spoke to [place] at [facility] . [facility] [phone] provides all PT /  OT needs at [facility] . Spoke to [place] at [facility] instructed to fax orders .  Paged MD for HHPT/OT orders . SW Gina aware patient being discharged today . Magdalen Spatz RN BSN    HHPT orders and face to face faxed to [place] , confirmed receipt.   Expected Discharge Date:   09-28-14                Expected Discharge Plan:  [place]  In-House Referral:  Clinical Social Work  Discharge planning Services  CM Consult  Post Acute Care Choice:  Home Health Choice offered to:     DME Arranged:    DME Agency:     HH Arranged:  pt [place] Agency:  Other - See comment  Status of Service:  Completed, signed off  Medicare Important Message Given:    Date Medicare IM Given:    Medicare IM give by:    Date Additional Medicare IM Given:    Additional Medicare Important Message give by:     If discussed at [place] of Stay Meetings, dates discussed:    Additional Comments:  Marilu Favre, RN 09/28/2014, 11:17 AM

## 2014-09-28 NOTE — Discharge Summary (Addendum)
Physician Discharge Summary  [name] HGD:[id number] DOB: 04-29-1923 DOA: 09/26/2014  PCP: Pcp Not In System  Admit date: 09/26/2014 Discharge date: 09/28/2014  Time spent: 50 minutes  Recommendations for Outpatient Follow-up:  1. She will return to assisted living with home health PT  Discharge Condition: Stable Diet recommendation: Heart healthy low-sodium  Discharge Diagnoses:  Principal Problem:   Unresponsive episode Active Problems: AKI (acute kidney injury)   Hypertension   A-fib   Hyperlipidemia   History of present illness:  Anne Armstrong is a 81 y.o. female past medical history of atrial fibrillation, hypertension, hyperlipidemia, recent strep viridans endocarditis and health care acquired pneumonia versus aspiration pneumonia. The patient is awake and alert at this time and able to give a history but she was sent to the hospital by her family when she was difficult to arouse yesterday. The patient states that she had a very bad night and was not able to sleep until after 2 AM. When family was not able to arouse her the next day, EMS was called. When EMS was about to intubate her, the patient woke up and was alert and oriented 3. When evaluated in the ER, she continued to be well oriented but due to family's concerns, she was kept overnight for further observation. Head CT was negative. The patient tells me today that she was simply sleeping. She is also noted to have mild acute renal failure IV fluids were started.  Hospital Course:  Principal Problem:  Unresponsive episode -She has been awake alert and oriented even prior to presenting to the ER and has remained stable throughout the hospital stay - CT head negative-no other neurological symptoms- no arrythmias noted on telemetry- no further work up at this time  Active Problems: Acute renal failure/dehydration -Elevated BUN/creatinine ratio --baseline creatinine is less than 1-she was admitted with a BUN of 30 and  creatinine of 1.30 -Repeat creatinine check today after eating given IV fluids since admission reveals a creatinine of 1.12  Urinary tract infection? -Despite having a positive UA, she is asymptomatic and therefore I discontinued Rocephin after her dose yesterday - she also received ciprofloxacin in the ER -ADDENDUM- DAUGHTER INSISTING THAT SHE BE TREATED WITH ANABIOTICS-  WILL GIVE HER 2 MORE DAYS OF LEVAQUIN- THIS WILL COMPLETE A 3 DAY COURSE  Fecal impaction -Hard stool noticed by nursing staff was unable to manually disimpact her-enema ordered with resultant large bowel movement   Hypertension -Continue metoprolol   A-fib -Continue metoprolol and baby aspirin daily   Discharge Exam: Filed Weights   09/27/14 0129  Weight: 57.743 kg (127 lb 4.8 oz)   Filed Vitals:   09/28/14 0633  BP: 174/67  Pulse: 70  Temp: 97.5 F (36.4 C)  Resp: 18    General: AAO x 3, no distress Cardiovascular: RRR, no murmurs  Respiratory: clear to auscultation bilaterally GI: soft, non-tender, non-distended, bowel sound positive  Discharge Instructions You were cared for by a hospitalist during your hospital stay. If you have any questions about your discharge medications or the care you received while you were in the hospital after you are discharged, you can call the unit and asked to speak with the hospitalist on call if the hospitalist that took care of you is not available. Once you are discharged, your primary care physician will handle any further medical issues. Please note that NO REFILLS for any discharge medications will be authorized once you are discharged, as it is imperative that you return to your primary  care physician (or establish a relationship with a primary care physician if you do not have one) for your aftercare needs so that they can reassess your need for medications and monitor your lab values.  Discharge Instructions    Diet - low sodium heart healthy    Complete by:  As  directed      Increase activity slowly    Complete by:  As directed             Medication List               TAKE these medications        aspirin EC 81 MG tablet  Take 81 mg by mouth daily.     atorvastatin 20 MG tablet  Commonly known as:  LIPITOR  Take 20 mg by mouth every evening.     calcium carbonate 500 MG chewable tablet  Commonly known as:  TUMS - dosed in mg elemental calcium  Chew 1-2 tablets by mouth daily.     CALTRATE 600+D PO  Take 1 tablet by mouth daily.     docusate sodium 100 MG capsule  Commonly known as:  COLACE  Take 100 mg by mouth daily.     gabapentin 300 MG capsule  Commonly known as:  NEURONTIN  Take 300 mg by mouth 2 (two) times daily.     hydrALAZINE 25 MG tablet  Commonly known as:  APRESOLINE  Take 25 mg by mouth 2 (two) times daily.     HYDROcodone-acetaminophen 5-325 MG per tablet  Commonly known as:  NORCO/VICODIN  Take 1 tablet by mouth every 6 (six) hours as needed.     meclizine 12.5 MG tablet  Commonly known as:  ANTIVERT  Take 1 tablet (12.5 mg total) by mouth 3 (three) times daily as needed for dizziness or nausea.     metoprolol tartrate 25 MG tablet  Commonly known as:  LOPRESSOR  Take 25 mg by mouth 2 (two) times daily.     omeprazole 40 MG capsule  Commonly known as:  PRILOSEC  Take 1 capsule (40 mg total) by mouth daily before breakfast.     PROAIR HFA 108 (90 BASE) MCG/ACT inhaler  Generic drug:  albuterol  Inhale 1 puff into the lungs every 6 (six) hours as needed for wheezing or shortness of breath.     promethazine 25 MG tablet  Commonly known as:  PHENERGAN  Take 25 mg by mouth every 6 (six) hours as needed for nausea or vomiting.     traMADol 50 MG tablet  Commonly known as:  ULTRAM  Take one tablet by mouth twice daily for pain; Take one tablet by mouth four times daily as needed for moderate pain       Allergies  Allergen Reactions  . Other Other (See Comments)    BEEF AND PORK PRODUCTS  CAUSED SEVERE ESOPHAGEAL ISSUES AND SEVERE PAINS IN LEFT CHEST AND NECK AREA  Patient has taken Heparin in past, that is ok.   . Iodine     Unknown reaction per MAR   . Macrobid [Nitrofurantoin]     Unknown reaction per MAR   . Novocain [Procaine]     Unknown reaction per MAR   . Penicillins     Unknown reaction per MAR   . Shellfish Allergy     Unknown reaction per MAR   . Sulfa Antibiotics     Unknown reaction per [facility]       The  results of significant diagnostics from this hospitalization (including imaging, microbiology, ancillary and laboratory) are listed below for reference.    Significant Diagnostic Studies: Ct Head Wo Contrast  09/26/2014   CLINICAL DATA:  81 year old female with altered mental status.  EXAM: CT HEAD WITHOUT CONTRAST  TECHNIQUE: Contiguous axial images were obtained from the base of the skull through the vertex without intravenous contrast.  COMPARISON:  07/02/2014 and prior exams  FINDINGS: Moderate atrophy and chronic small-vessel white matter ischemic changes again noted. Remote basal ganglia and cerebellar infarcts are again noted.  No acute intracranial abnormalities are identified, including mass lesion or mass effect, hydrocephalus, extra-axial fluid collection, midline shift, hemorrhage, or acute infarction.  The visualized bony calvarium is unremarkable.  IMPRESSION: No evidence of acute intracranial abnormality.  Atrophy, chronic small-vessel white matter ischemic changes and remote basal ganglia and cerebellar infarcts.   Electronically Signed   By: Margarette Canada M.D.   On: 09/26/2014 22:02   Dg Chest Port 1 View  09/26/2014   CLINICAL DATA:  81 year old female with altered mental status. Found unresponsive.  EXAM: PORTABLE CHEST - 1 VIEW  COMPARISON:  07/02/2014  FINDINGS: Cardiomegaly is unchanged allowing for differences in technique. There is improved left basilar aeration with minimal residual atelectasis. Mild atelectasis or scarring at the right lung  base. No new consolidation. The right-sided central line has been removed. No pulmonary edema, pleural effusion, or pneumothorax. No acute osseous abnormalities are seen.  IMPRESSION: Stable cardiomegaly. Improved left base aeration. Stable scarring or atelectasis at the right lung base.   Electronically Signed   By: Jeb Levering M.D.   On: 09/26/2014 21:37   Dg Foot Complete Left  08/30/2014   CLINICAL DATA:  Left foot pain several days ago.  No trauma.  EXAM: LEFT FOOT - COMPLETE 3+ VIEW  COMPARISON:  None.  FINDINGS: Negative for fracture, dislocation or radiopaque foreign body. Moderate arthritic changes are present about the interphalangeal joints. There is no bone lesion or bony destruction.  IMPRESSION: Mild interphalangeal joint arthritic changes. No acute findings are evident.   Electronically Signed   By: Andreas Newport M.D.   On: 08/30/2014 04:16    Microbiology: Recent Results (from the past 240 hour(s))  Urine culture     Status: None (Preliminary result)   Collection Time: 09/26/14 10:50 PM  Result Value Ref Range Status   Specimen Description URINE, CATHETERIZED  Final   Special Requests ADD 0055 09/27/14  Final   Colony Count   Final    >=100,000 COLONIES/ML Performed at [facility]    Culture   Final    [place] Performed at [facility]    Report Status PENDING  Incomplete  MRSA PCR Screening     Status: Abnormal   Collection Time: 09/27/14  2:31 AM  Result Value Ref Range Status   MRSA by PCR POSITIVE (A) NEGATIVE Final    Comment:        The GeneXpert MRSA Assay (FDA approved for NASAL specimens only), is one component of a comprehensive MRSA colonization surveillance program. It is not intended to diagnose MRSA infection nor to guide or monitor treatment for MRSA infections. RESULT CALLED TO, READ BACK BY AND VERIFIED WITH: CELSO,S RN [id number] 09/27/14 MITCHELL,L      Labs: Basic Metabolic Panel:  Recent Labs Lab  09/26/14 2107 09/26/14 2127 09/27/14 0258 09/28/14 0444  NA 141 141 139 136  K 4.0 4.0 4.2 3.6  CL 105 103  103 103  CO2 26  --  22 24  GLUCOSE 109* 108* 143* 90  BUN 30* 30* 29* 24*  CREATININE 1.25* 1.30* 1.26*  1.26* 1.12*  CALCIUM 10.7*  --  10.6* 9.0   Liver Function Tests:  Recent Labs Lab 09/26/14 2107 09/27/14 0258  AST 19 39  ALT 14 15  ALKPHOS 88 103  BILITOT 0.7 0.9  PROT 5.7* 6.7  ALBUMIN 3.0* 3.2*   No results for input(s): LIPASE, AMYLASE in the last 168 hours. No results for input(s): AMMONIA in the last 168 hours. CBC:  Recent Labs Lab 09/26/14 2107 09/26/14 2127 09/27/14 0258  WBC 9.2  --  10.0  NEUTROABS 5.4  --   --   HGB 10.4* 10.9* 12.0  HCT 31.8* 32.0* 36.4  MCV 80.3  --  81.1  PLT 246  --  243   Cardiac Enzymes: No results for input(s): CKTOTAL, CKMB, CKMBINDEX, TROPONINI in the last 168 hours. BNP: BNP (last 3 results) No results for input(s): BNP in the last 8760 hours.  ProBNP (last 3 results) No results for input(s): PROBNP in the last 8760 hours.  CBG:  Recent Labs Lab 09/26/14 2045 09/27/14 0724 09/28/14 0805  GLUCAP 99 110* 87       Signed:  Debbe Odea, MD [facility] 09/28/2014, 2:50 PM

## 2014-09-28 NOTE — Clinical Social Work Note (Signed)
CSW was consulted for return to facility.  [place] is an independent level of living.  Patient will discharge as one would discharge home- not facility.  RNCM aware.  CSW contacted RN to inquire of transportation needs.  RN anticipates EMS need at time of discharge.  CSW will complete Medical Necessity form for [facility] and place on chart for when the patient is ready to discharge.  CSW arranged with patient's daughter for someone to be home for patient to arrive via [facility] after 2pm.  Address was verified with patient's daughter.  Nonnie Done, LCSW [phone]  Psychiatric & Orthopedics (5N 1-8) Clinical Social Worker

## 2014-09-29 NOTE — Care Management (Signed)
Patient was active with [facility] , called Waunita Schooner at [facility] . Spoke to Esmond Plants at [facility] and faxed Ms Aline Brochure orders and all required information. Magdalen Spatz RN BSN

## 2014-10-03 LAB — URINE CULTURE

## 2014-10-07 ENCOUNTER — Other Ambulatory Visit: Payer: Self-pay | Admitting: Adult Health

## 2014-12-09 ENCOUNTER — Other Ambulatory Visit: Payer: Self-pay | Admitting: Adult Health

## 2015-03-04 ENCOUNTER — Other Ambulatory Visit: Payer: Self-pay | Admitting: Adult Health

## 2015-08-27 DEATH — deceased

## 2016-09-01 IMAGING — CT CT HEAD W/O CM
1 series · 16 of 30 positions shown, 20 images · non-contrast
Comparison: 07/02/2014 and prior exams

CLINICAL DATA: 89-year-old female with altered mental status.

EXAM:
CT HEAD WITHOUT CONTRAST
TECHNIQUE: Contiguous axial images were obtained from the base of the skull
through the vertex without intravenous contrast.

[Series 2: head 5.0 h30s · axial · 0.41mm/px · z∈[-254,-119]mm · 16 of 30 slices shown, 20 images]
[im 2/30  brain]
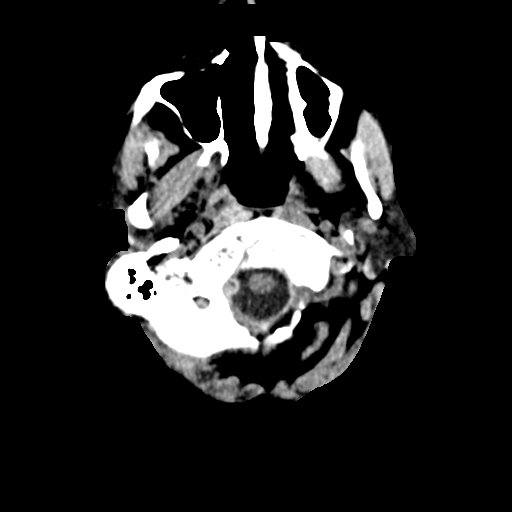
[im 2/30  bone]
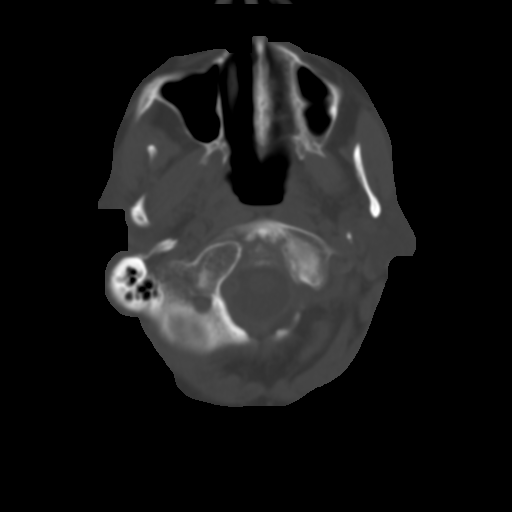
[im 4/30  brain]
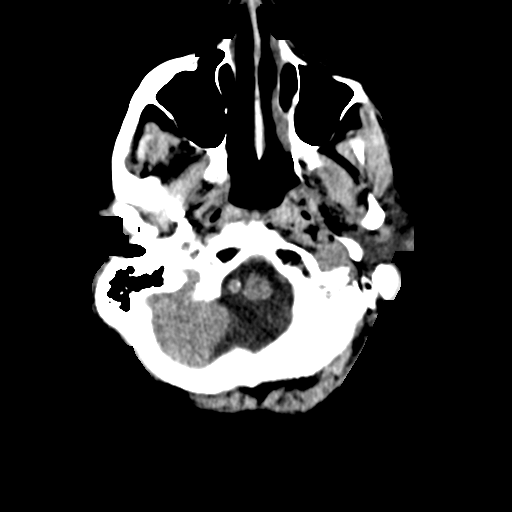
[im 6/30  brain]
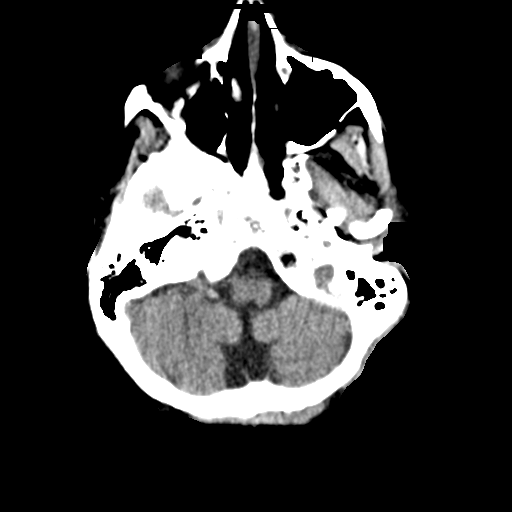
[im 8/30  brain]
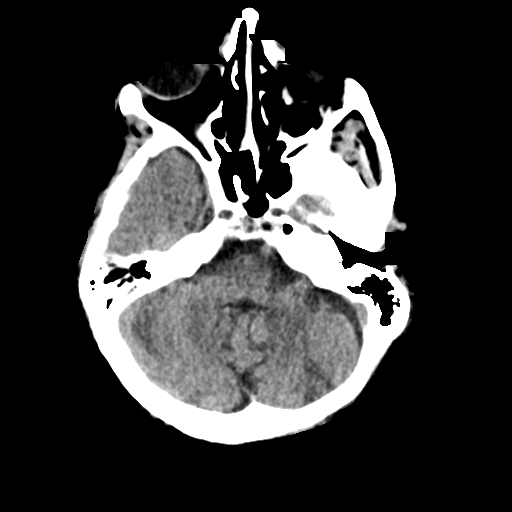
[im 9/30  brain]
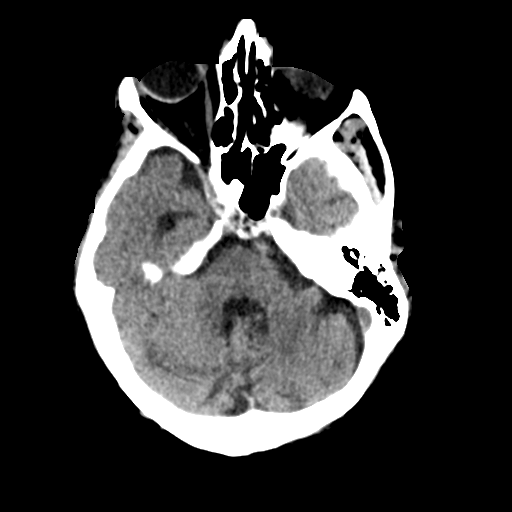
[im 9/30  bone]
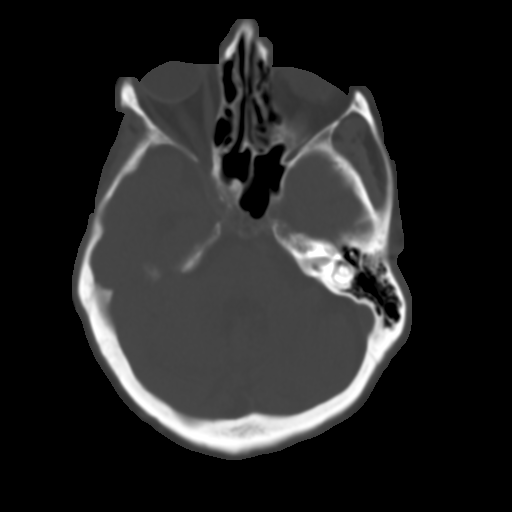
[im 11/30  brain]
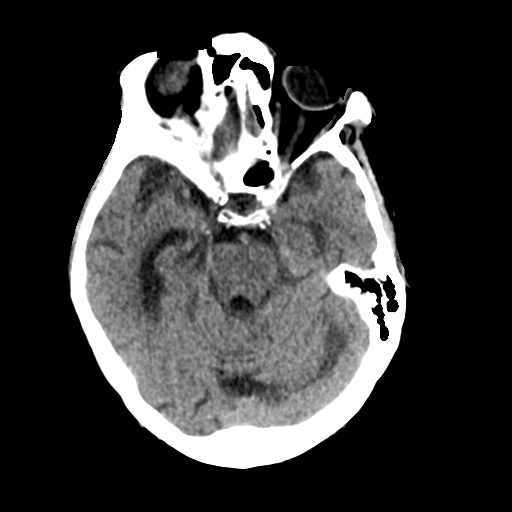
[im 13/30  brain]
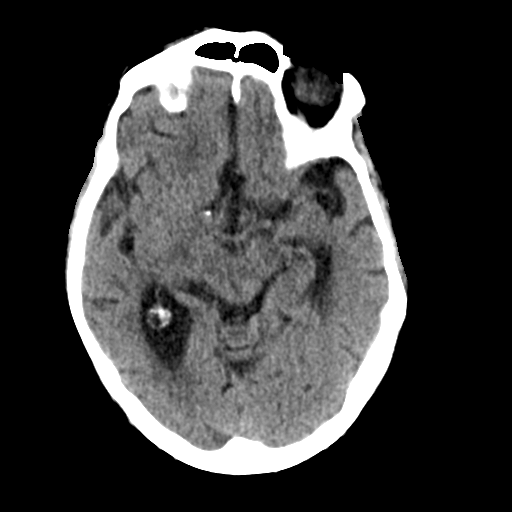
[im 15/30  brain]
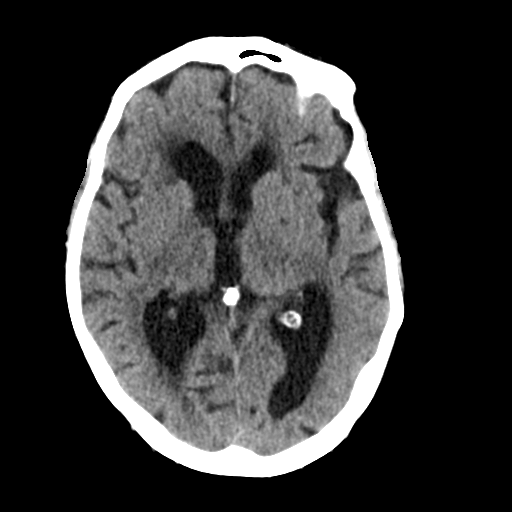
[im 16/30  brain]
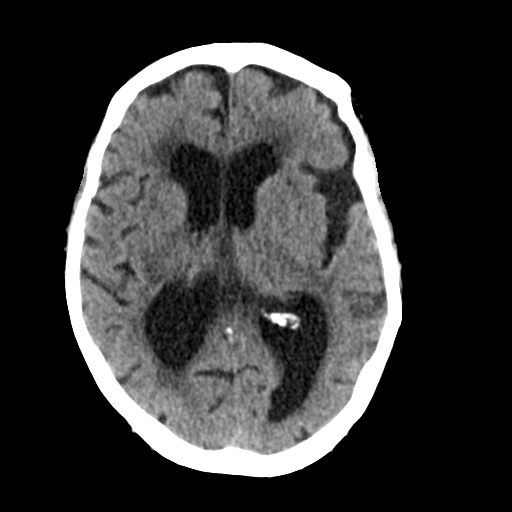
[im 16/30  bone]
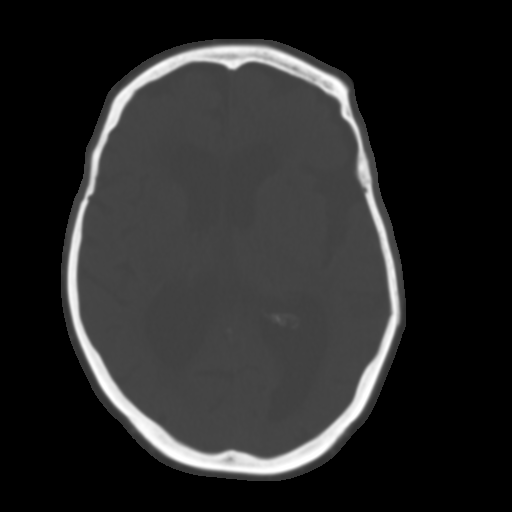
[im 18/30  brain]
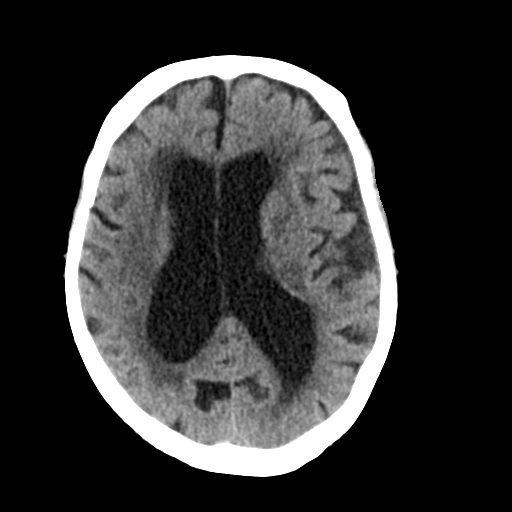
[im 20/30  brain]
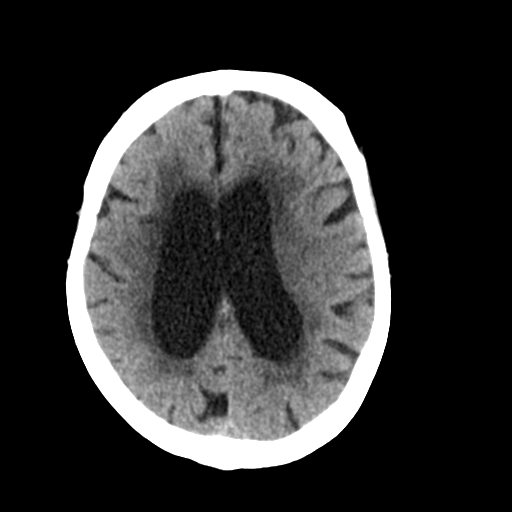
[im 22/30  brain]
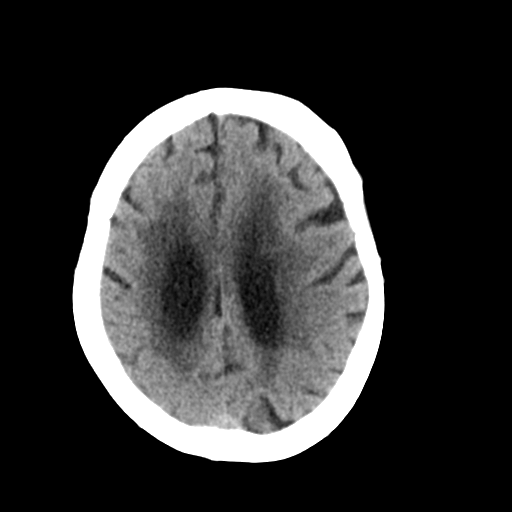
[im 23/30  brain]
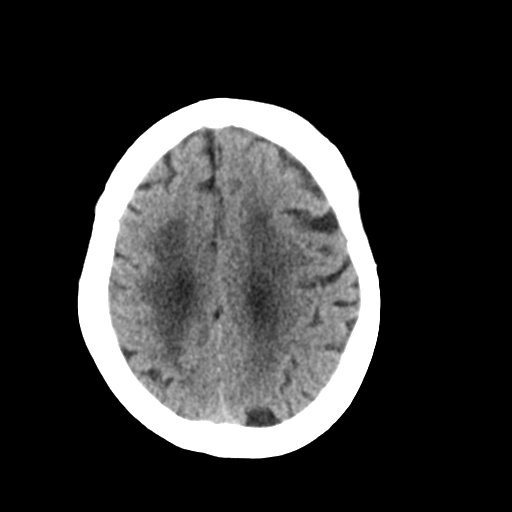
[im 23/30  bone]
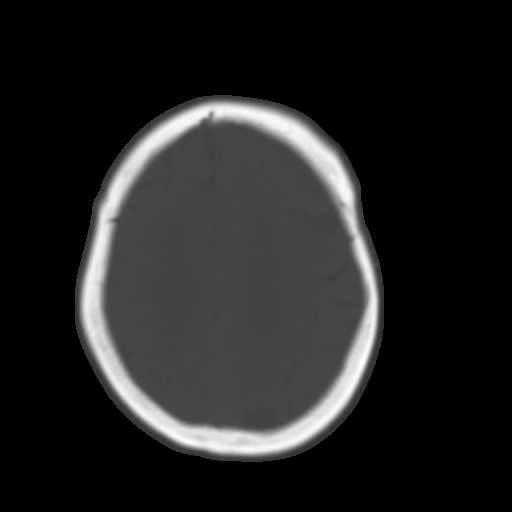
[im 25/30  brain]
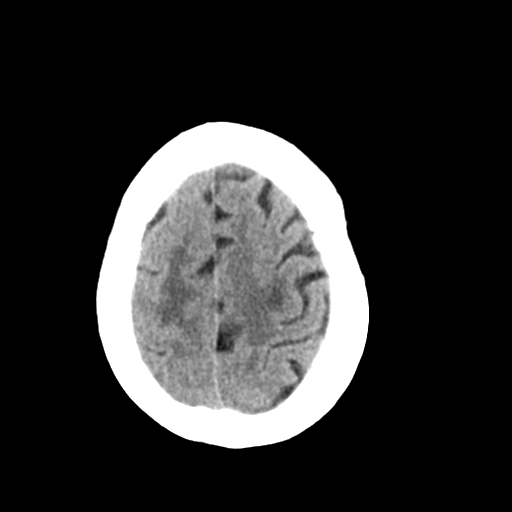
[im 27/30  brain]
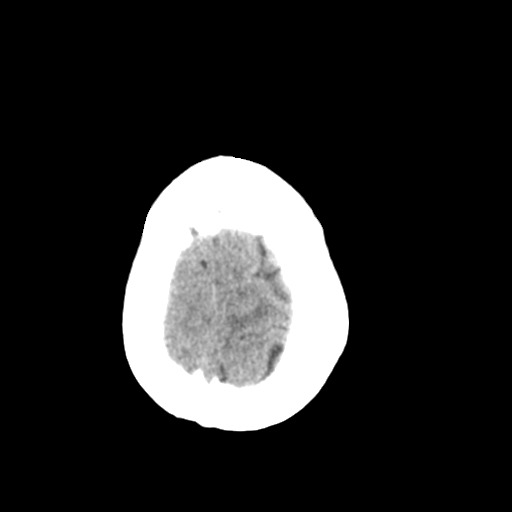
[im 29/30  brain]
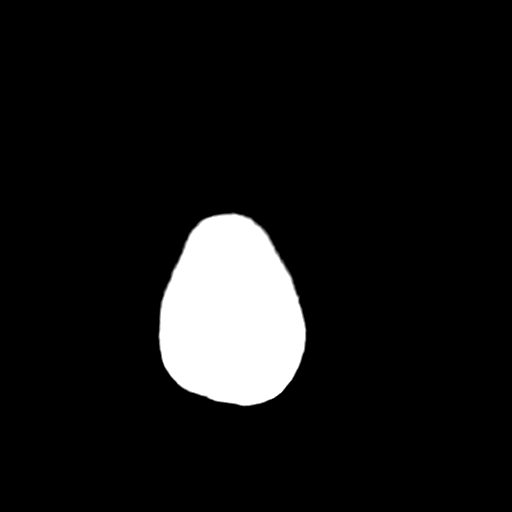

[16 of 30 positions shown; findings below may reference images not displayed]

FINDINGS: Moderate atrophy and chronic small-vessel white matter ischemic
changes again noted. Remote basal ganglia and cerebellar infarcts
are again noted.

No acute intracranial abnormalities are identified, including mass
lesion or mass effect, hydrocephalus, extra-axial fluid collection,
midline shift, hemorrhage, or acute infarction.

The visualized bony calvarium is unremarkable.
IMPRESSION: No evidence of acute intracranial abnormality.

Atrophy, chronic small-vessel white matter ischemic changes and
remote basal ganglia and cerebellar infarcts.

## 2016-09-01 IMAGING — CR DG CHEST 1V PORT
1 series · 1 of 1 positions shown · non-contrast
Comparison: 07/02/2014

CLINICAL DATA: 89-year-old female with altered mental status. Found
unresponsive.

EXAM:
PORTABLE CHEST - 1 VIEW

[AP]
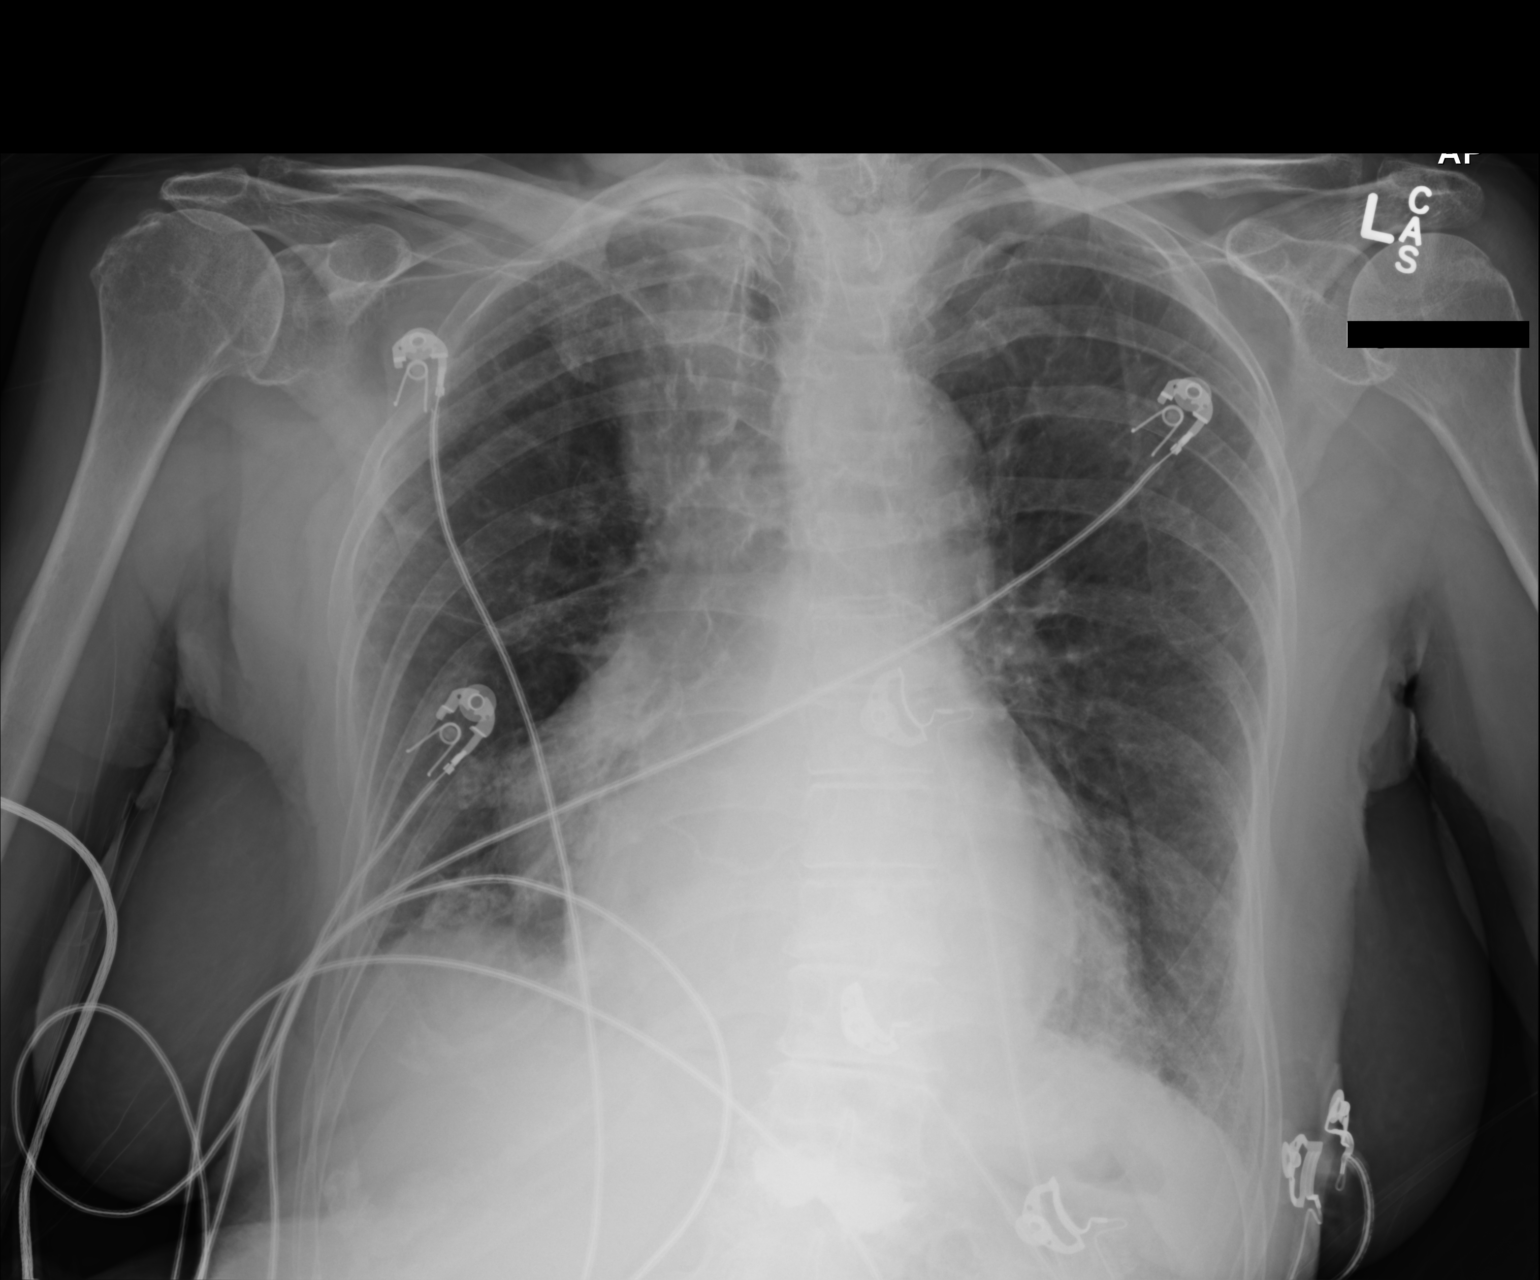

[1 of 1 positions shown; findings below may reference images not displayed]

FINDINGS: Cardiomegaly is unchanged allowing for differences in technique.
There is improved left basilar aeration with minimal residual
atelectasis. Mild atelectasis or scarring at the right lung base. No
new consolidation. The right-sided central line has been removed. No
pulmonary edema, pleural effusion, or pneumothorax. No acute osseous
abnormalities are seen.
IMPRESSION: Stable cardiomegaly. Improved left base aeration. Stable scarring or
atelectasis at the right lung base.
